# Patient Record
Sex: Female | Born: 1970 | Race: White | Hispanic: No | Marital: Single | Smoking: Never smoker
Health system: Southern US, Community
[De-identification: ages and names within clinical notes are randomized; demographics above are authoritative.]

## PROBLEM LIST (undated history)

## (undated) DIAGNOSIS — F32A Depression, unspecified: Secondary | ICD-10-CM

## (undated) DIAGNOSIS — E278 Other specified disorders of adrenal gland: Secondary | ICD-10-CM

## (undated) DIAGNOSIS — E669 Obesity, unspecified: Secondary | ICD-10-CM

## (undated) DIAGNOSIS — F329 Major depressive disorder, single episode, unspecified: Secondary | ICD-10-CM

## (undated) DIAGNOSIS — D1803 Hemangioma of intra-abdominal structures: Secondary | ICD-10-CM

## (undated) DIAGNOSIS — R87810 Cervical high risk human papillomavirus (HPV) DNA test positive: Secondary | ICD-10-CM

## (undated) DIAGNOSIS — E785 Hyperlipidemia, unspecified: Secondary | ICD-10-CM

## (undated) DIAGNOSIS — K769 Liver disease, unspecified: Secondary | ICD-10-CM

## (undated) DIAGNOSIS — E66811 Obesity, class 1: Secondary | ICD-10-CM

## (undated) HISTORY — DX: Other specified disorders of adrenal gland: E27.8

## (undated) HISTORY — DX: Major depressive disorder, single episode, unspecified: F32.9

## (undated) HISTORY — DX: Liver disease, unspecified: K76.9

## (undated) HISTORY — DX: Hemangioma of intra-abdominal structures: D18.03

## (undated) HISTORY — DX: Depression, unspecified: F32.A

## (undated) HISTORY — DX: Cervical high risk human papillomavirus (HPV) DNA test positive: R87.810

## (undated) HISTORY — PX: COLPOSCOPY: SHX161

## (undated) HISTORY — PX: CHOLECYSTECTOMY: SHX55

## (undated) HISTORY — DX: Obesity, class 1: E66.811

## (undated) HISTORY — DX: Obesity, unspecified: E66.9

## (undated) HISTORY — PX: BREAST ENHANCEMENT SURGERY: SHX7

## (undated) HISTORY — DX: Hyperlipidemia, unspecified: E78.5

---

## 2006-03-12 ENCOUNTER — Encounter: Admission: RE | Admit: 2006-03-12 | Discharge: 2006-03-12 | Payer: Self-pay | Admitting: Internal Medicine

## 2016-10-15 ENCOUNTER — Ambulatory Visit (INDEPENDENT_AMBULATORY_CARE_PROVIDER_SITE_OTHER): Payer: BC Managed Care – PPO | Admitting: Physician Assistant

## 2016-10-15 ENCOUNTER — Encounter: Payer: Self-pay | Admitting: Physician Assistant

## 2016-10-15 VITALS — BP 114/74 | HR 81 | Ht 67.0 in | Wt 211.0 lb

## 2016-10-15 DIAGNOSIS — Z6833 Body mass index (BMI) 33.0-33.9, adult: Secondary | ICD-10-CM | POA: Diagnosis not present

## 2016-10-15 DIAGNOSIS — E6609 Other obesity due to excess calories: Secondary | ICD-10-CM

## 2016-10-15 DIAGNOSIS — Z30011 Encounter for initial prescription of contraceptive pills: Secondary | ICD-10-CM

## 2016-10-15 DIAGNOSIS — Z0000 Encounter for general adult medical examination without abnormal findings: Secondary | ICD-10-CM

## 2016-10-15 LAB — POCT URINE PREGNANCY: Preg Test, Ur: NEGATIVE

## 2016-10-15 MED ORDER — LEVONORGESTREL-ETHINYL ESTRAD 0.1-20 MG-MCG PO TABS: 1.0000 | ORAL_TABLET | Freq: Every day | ORAL | 11 refills | Status: DC

## 2016-10-15 NOTE — Patient Instructions (Addendum)
Use condoms for the next 2 weeks Check your blood pressures at home for the next month  Limit Salt Increase physical activity Follow DASH eating plan Follow-up in 4 weeks   How to Take Your Blood Pressure Blood pressure is a measurement of how strongly your blood is pressing against the walls of your arteries. Arteries are blood vessels that carry blood from your heart throughout your body. Your health care provider takes your blood pressure at each office visit. You can also take your own blood pressure at home with a blood pressure machine. You may need to take your own blood pressure:  To confirm a diagnosis of high blood pressure (hypertension).  To monitor your blood pressure over time.  To make sure your blood pressure medicine is working. Supplies needed: To take your blood pressure, you will need a blood pressure machine. You can buy a blood pressure machine, or blood pressure monitor, at most drugstores or online. There are several types of home blood pressure monitors. When choosing one, consider the following:  Choose a monitor that has an arm cuff.  Choose a monitor that wraps snugly around your upper arm. You should be able to fit only one finger between your arm and the cuff.  Do not choose a monitor that measures your blood pressure from your wrist or finger. Your health care provider can suggest a reliable monitor that will meet your needs. How to prepare To get the most accurate reading, avoid the following for 30 minutes before you check your blood pressure:  Drinking caffeine.  Drinking alcohol.  Eating.  Smoking.  Exercising. Five minutes before you check your blood pressure:  Empty your bladder.  Sit quietly without talking in a dining chair, rather than in a soft couch or armchair. How to take your blood pressure To check your blood pressure, follow the instructions in the manual that came with your blood pressure monitor. If you have a digital blood  pressure monitor, the instructions may be as follows: 1. Sit up straight. 2. Place your feet on the floor. Do not cross your ankles or legs. 3. Rest your left arm at the level of your heart on a table or desk or on the arm of a chair. 4. Pull up your shirt sleeve. 5. Wrap the blood pressure cuff around the upper part of your left arm, 1 inch (2.5 cm) above your elbow. It is best to wrap the cuff around bare skin. 6. Fit the cuff snugly around your arm. You should be able to place only one finger between the cuff and your arm. 7. Position the cord inside the groove of your elbow. 8. Press the power button. 9. Sit quietly while the cuff inflates and deflates. 10. Read the digital reading on the monitor screen and write it down (record it). 11. Wait 2-3 minutes, then repeat the steps, starting at step 1. What does my blood pressure reading mean? A blood pressure reading consists of a higher number over a lower number. Ideally, your blood pressure should be below 120/80. The first ("top") number is called the systolic pressure. It is a measure of the pressure in your arteries as your heart beats. The second ("bottom") number is called the diastolic pressure. It is a measure of the pressure in your arteries as the heart relaxes. Blood pressure is classified into four stages. The following are the stages for adults who do not have a short-term serious illness or a chronic condition. Systolic pressure and  diastolic pressure are measured in a unit called mm Hg. Normal   Systolic pressure: below 432.  Diastolic pressure: below 80. Elevated   Systolic pressure: 761-470.  Diastolic pressure: below 80. Hypertension stage 1   Systolic pressure: 929-574.  Diastolic pressure: 73-40. Hypertension stage 2   Systolic pressure: 370 or above.  Diastolic pressure: 90 or above. You can have prehypertension or hypertension even if only the systolic or only the diastolic number in your reading is higher  than normal. Follow these instructions at home:  Check your blood pressure as often as recommended by your health care provider.  Take your monitor to the next appointment with your health care provider to make sure:  That you are using it correctly.  That it provides accurate readings.  Be sure you understand what your goal blood pressure numbers are.  Tell your health care provider if you are having any side effects from blood pressure medicine. Contact a health care provider if:  Your blood pressure is consistently high. Get help right away if:  Your systolic blood pressure is higher than 180.  Your diastolic blood pressure is higher than 110. This information is not intended to replace advice given to you by your health care provider. Make sure you discuss any questions you have with your health care provider. Document Released: 12/27/2015 Document Revised: 03/10/2016 Document Reviewed: 12/27/2015 Elsevier Interactive Patient Education  2017 [employer].

## 2016-10-15 NOTE — Progress Notes (Signed)
HPI:                                                                Katherine Reeves is a 46 y.o. female who presents to [facility] today to establish care   Current Concerns include contraceptive counseling  Patient [name] presents for annual physical exam and contraceptive counseling. She is sexually active with 1 female partner. She denies abnormal vaginal discharge, vaginal discomfort, pelvic pain, or dyspareunia. She denies breast changes or nipple discharge. She has been on OCP's in the past. She is a nonsmoker. She has no history of HTN, DVT, or clotting disorder.  Health Maintenance Health Maintenance  Topic Date Due  . HIV Screening  03/22/1985  . PAP SMEAR  03/23/1991  . INFLUENZA VACCINE  04/03/2017 (Originally 03/03/2016)  . TETANUS/TDAP  12/01/2024    GYN/Sexual Health [facility]  Menstrual status: having periods  LMP: 09/18/16  Menses: normal  Last pap smear: 3 years ago  History of abnormal pap smears: yes, HPV+   Sexually active: yes, 1 female partner  Current contraception: condoms  Health Habits  Diet: fair, 2 cups coffee/tea/soda per day  Exercise: no  ETOH: yes, 1 drink per month  Tobacco: none  Drugs: none   Past Medical History:  Diagnosis Date  . Cervical high risk HPV (human papillomavirus) test positive   . Depression    Past Surgical History:  Procedure Laterality Date  . BREAST ENHANCEMENT SURGERY    . CHOLECYSTECTOMY    . COLPOSCOPY     Social History  Substance Use Topics  . Smoking status: Never Smoker  . Smokeless tobacco: Never Used  . Alcohol use Yes     Comment: rare   family history includes Breast cancer in her maternal grandfather; Cervical cancer in her maternal aunt.  ROS: negative except as noted in the HPI  Medications: Current Outpatient Prescriptions  Medication Sig Dispense Refill  . levonorgestrel-ethinyl estradiol (AVIANE) 0.1-20 MG-MCG tablet Take 1  tablet by mouth daily. 1 Package 11   No current facility-administered medications for this visit.    No Known Allergies     Objective:  BP 114/74 (BP Location: Left Arm, Cuff Size: Normal)   Pulse 81   Ht 5\' 7"  (1.702 m)   Wt 211 lb (95.7 kg)   BMI 33.05 kg/m   Gen: well-groomed, cooperative, not ill-appearing, no distress HEENT: normal conjunctiva, TM's clear, oropharynx clear, moist mucus membranes, no thyromegaly or tenderness Pulm: Normal work of breathing, clear to auscultation bilaterally CV: Normal rate, regular rhythm, s1 and s2 distinct, no murmurs, clicks or rubs appreciated on this exam, no carotid bruit GI: soft, nondistended, nontender, no masses Neuro: alert and oriented x 3, EOM's intact, PERRLA, DTR's intact MSK: strength 5/5 and symmetric, normal gait, distal pulses intact, no peripheral edema Skin: warm and dry, no rashes or lesions on exposed skin Psych: normal affect, pleasant mood, normal speech and thought content  Depression screen [facility] 2/9 10/15/2016  Decreased Interest 0  Down, Depressed, Hopeless 0  PHQ - 2 Score 0      Assessment and Plan: 46 y.o. female with   1. Encounter for initial prescription of contraceptive pills - POCT urine pregnancy negative - recheck BP  normotensive today - reviewed hormonal and non-hormonal contraceptive options, including LARCs. Patient opted for OCP's. - declined STI testing - levonorgestrel-ethinyl estradiol (AVIANE) 0.1-20 MG-MCG tablet; Take 1 tablet by mouth daily.  Dispense: 1 Package; Refill: 11 - follow-up BP check in 1 month   2. Annual physical exam - CBC - Comprehensive metabolic panel - Hemoglobin A1c - Lipid Panel w/reflex Direct LDL - MM DIGITAL SCREENING BILATERAL; Future - due for Pap smear  3. Obesity (BMI 33) - DASH eating plan - checking lipid panel and A1c   Patient education and anticipatory guidance given Patient agrees with treatment plan Follow-up in 4 weeks for BP check or  sooner as needed  Darlyne Russian PA-C

## 2016-10-18 ENCOUNTER — Encounter: Payer: Self-pay | Admitting: Physician Assistant

## 2016-10-18 DIAGNOSIS — E782 Mixed hyperlipidemia: Secondary | ICD-10-CM | POA: Insufficient documentation

## 2016-10-18 DIAGNOSIS — Z6833 Body mass index (BMI) 33.0-33.9, adult: Secondary | ICD-10-CM

## 2016-10-18 DIAGNOSIS — Z3041 Encounter for surveillance of contraceptive pills: Secondary | ICD-10-CM | POA: Insufficient documentation

## 2016-10-18 DIAGNOSIS — E6609 Other obesity due to excess calories: Secondary | ICD-10-CM | POA: Insufficient documentation

## 2016-11-04 ENCOUNTER — Other Ambulatory Visit (HOSPITAL_COMMUNITY)
Admission: RE | Admit: 2016-11-04 | Discharge: 2016-11-04 | Disposition: A | Discharge status: Home / Self Care | Payer: BC Managed Care – PPO | Source: Ambulatory Visit | Attending: Physician Assistant | Admitting: Physician Assistant

## 2016-11-04 ENCOUNTER — Ambulatory Visit (INDEPENDENT_AMBULATORY_CARE_PROVIDER_SITE_OTHER): Payer: BC Managed Care – PPO | Admitting: Physician Assistant

## 2016-11-04 VITALS — BP 115/76 | HR 75 | Wt 207.0 lb

## 2016-11-04 DIAGNOSIS — Z01419 Encounter for gynecological examination (general) (routine) without abnormal findings: Secondary | ICD-10-CM | POA: Insufficient documentation

## 2016-11-04 DIAGNOSIS — Z1151 Encounter for screening for human papillomavirus (HPV): Secondary | ICD-10-CM | POA: Insufficient documentation

## 2016-11-04 DIAGNOSIS — Z124 Encounter for screening for malignant neoplasm of cervix: Secondary | ICD-10-CM

## 2016-11-04 NOTE — Progress Notes (Signed)
HPI:                                                                Katherine Reeves is a 46 y.o. 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 Denies abnormal vaginal discharge, DUB, abdominal pain or dyspareunia. She recently started COC's and is tolerating them well, but states she has noticed some mood swings.   Past Medical History:  Diagnosis Date  . Cervical high risk HPV (human papillomavirus) test positive   . Depression   . Hyperlipidemia   . Obesity (BMI 30.0-34.9)    Past Surgical History:  Procedure Laterality Date  . BREAST ENHANCEMENT SURGERY    . CHOLECYSTECTOMY    . COLPOSCOPY     Social History  Substance Use Topics  . Smoking status: Never Smoker  . Smokeless tobacco: Never Used  . Alcohol use Yes     Comment: rare   family history includes Breast cancer in her maternal grandfather; Cervical cancer in her maternal aunt.  ROS: negative except as noted in the HPI  Medications: Current Outpatient Prescriptions  Medication Sig Dispense Refill  . levonorgestrel-ethinyl estradiol (AVIANE) 0.1-20 MG-MCG tablet Take 1 tablet by mouth daily. 1 Package 11   No current facility-administered medications for this visit.    No Known Allergies     Objective:  BP 115/76   Pulse 75   Wt 207 lb (93.9 kg)   BMI 32.42 kg/m  Gen: well-groomed, cooperative, not ill-appearing, no distress GU: vulva without rashes or lesions, normal introitus and urethral meatus, vaginal mucosa without erythema, normal discharge, cervix non-friable without lesions Psych: good eye contact, euthymic mood, normal speech and thought content  Depression screen [facility] 2/9 10/15/2016  Decreased Interest 0  Down, Depressed, Hopeless 0  PHQ - 2 Score 0     Assessment and Plan: 45 y.o. female with   1. Pap smear for cervical cancer screening -  Cytology - PAP with HPV co-testing - PHQ2 negative. discussed that if she develops symptoms of depression while on birth control pills, to return   Mammogram ordered at last appointment. Patient states she accidentally deleted voicemail to schedule it. Provided her with phone number for imaging and encouraged her to schedule downstairs in imaging on her way out.  Patient education and anticipatory guidance given Patient agrees with treatment plan Follow-up in 1 year for CPE or sooner as needed  Darlyne Russian PA-C

## 2016-11-04 NOTE — Patient Instructions (Signed)
Health Maintenance, Female Adopting a healthy lifestyle and getting preventive care can go a long way to promote health and wellness. Talk with your health care provider about what schedule of regular examinations is right for you. This is a good chance for you to check in with your provider about disease prevention and staying healthy. In between checkups, there are plenty of things you can do on your own. Experts have done a lot of research about which lifestyle changes and preventive measures are most likely to keep you healthy. Ask your health care provider for more information. Weight and diet Eat a healthy diet  Be sure to include plenty of vegetables, fruits, low-fat dairy products, and lean protein.  Do not eat a lot of foods high in solid fats, added sugars, or salt.  Get regular exercise. This is one of the most important things you can do for your health.  Most adults should exercise for at least 150 minutes each week. The exercise should increase your heart rate and make you sweat (moderate-intensity exercise).  Most adults should also do strengthening exercises at least twice a week. This is in addition to the moderate-intensity exercise. Maintain a healthy weight  Body mass index (BMI) is a measurement that can be used to identify possible weight problems. It estimates body fat based on height and weight. Your health care provider can help determine your BMI and help you achieve or maintain a healthy weight.  For females 47 years of age and older:  A BMI below 18.5 is considered underweight.  A BMI of 18.5 to 24.9 is normal.  A BMI of 25 to 29.9 is considered overweight.  A BMI of 30 and above is considered obese. Watch levels of cholesterol and blood lipids  You should start having your blood tested for lipids and cholesterol at 47 years of age, then have this test every 5 years.  You may need to have your cholesterol levels checked more often if:  Your lipid or  cholesterol levels are high.  You are older than 47 years of age.  You are at high risk for heart disease. Cancer screening Lung Cancer  Lung cancer screening is recommended for adults 47-42 years old who are at high risk for lung cancer because of a history of smoking.  A yearly low-dose CT scan of the lungs is recommended for people who:  Currently smoke.  Have quit within the past 15 years.  Have at least a 30-pack-year history of smoking. A pack year is smoking an average of one pack of cigarettes a day for 1 year.  Yearly screening should continue until it has been 15 years since you quit.  Yearly screening should stop if you develop a health problem that would prevent you from having lung cancer treatment. Breast Cancer  Practice breast self-awareness. This means understanding how your breasts normally appear and feel.  It also means doing regular breast self-exams. Let your health care provider know about any changes, no matter how small.  If you are in your 20s or 30s, you should have a clinical breast exam (CBE) by a health care provider every 1-3 years as part of a regular health exam.  If you are 34 or older, have a CBE every year. Also consider having a breast X-ray (mammogram) every year.  If you have a family history of breast cancer, talk to your health care provider about genetic screening.  If you are at high risk for breast cancer, talk  to your health care provider about having an MRI and a mammogram every year.  Breast cancer gene (BRCA) assessment is recommended for women who have family members with BRCA-related cancers. BRCA-related cancers include:  Breast.  Ovarian.  Tubal.  Peritoneal cancers.  Results of the assessment will determine the need for genetic counseling and BRCA1 and BRCA2 testing. Cervical Cancer  Your health care provider may recommend that you be screened regularly for cancer of the pelvic organs (ovaries, uterus, and vagina).  This screening involves a pelvic examination, including checking for microscopic changes to the surface of your cervix (Pap test). You may be encouraged to have this screening done every 3 years, beginning at age 24.  For women ages 66-65, health care providers may recommend pelvic exams and Pap testing every 3 years, or they may recommend the Pap and pelvic exam, combined with testing for human papilloma virus (HPV), every 5 years. Some types of HPV increase your risk of cervical cancer. Testing for HPV may also be done on women of any age with unclear Pap test results.  Other health care providers may not recommend any screening for nonpregnant women who are considered low risk for pelvic cancer and who do not have symptoms. Ask your health care provider if a screening pelvic exam is right for you.  If you have had past treatment for cervical cancer or a condition that could lead to cancer, you need Pap tests and screening for cancer for at least 20 years after your treatment. If Pap tests have been discontinued, your risk factors (such as having a new sexual partner) need to be reassessed to determine if screening should resume. Some women have medical problems that increase the chance of getting cervical cancer. In these cases, your health care provider may recommend more frequent screening and Pap tests. Colorectal Cancer  This type of cancer can be detected and often prevented.  Routine colorectal cancer screening usually begins at 47 years of age and continues through 47 years of age.  Your health care provider may recommend screening at an earlier age if you have risk factors for colon cancer.  Your health care provider may also recommend using home test kits to check for hidden blood in the stool.  A small camera at the end of a tube can be used to examine your colon directly (sigmoidoscopy or colonoscopy). This is done to check for the earliest forms of colorectal cancer.  Routine  screening usually begins at age 47.  Direct examination of the colon should be repeated every 5-10 years through 47 years of age. However, you may need to be screened more often if early forms of precancerous polyps or small growths are found. Skin Cancer  Check your skin from head to toe regularly.  Tell your health care provider about any new moles or changes in moles, especially if there is a change in a mole's shape or color.  Also tell your health care provider if you have a mole that is larger than the size of a pencil eraser.  Always use sunscreen. Apply sunscreen liberally and repeatedly throughout the day.  Protect yourself by wearing long sleeves, pants, a wide-brimmed hat, and sunglasses whenever you are outside. Heart disease, diabetes, and high blood pressure  High blood pressure causes heart disease and increases the risk of stroke. High blood pressure is more likely to develop in:  People who have blood pressure in the high end of the normal range (130-139/85-89 mm Hg).  People who are overweight or obese.  People who are African American.  If you are 65-6 years of age, have your blood pressure checked every 3-5 years. If you are 33 years of age or older, have your blood pressure checked every year. You should have your blood pressure measured twice-once when you are at a hospital or clinic, and once when you are not at a hospital or clinic. Record the average of the two measurements. To check your blood pressure when you are not at a hospital or clinic, you can use:  An automated blood pressure machine at a pharmacy.  A home blood pressure monitor.  If you are between 61 years and 23 years old, ask your health care provider if you should take aspirin to prevent strokes.  Have regular diabetes screenings. This involves taking a blood sample to check your fasting blood sugar level.  If you are at a normal weight and have a low risk for diabetes, have this test once  every three years after 47 years of age.  If you are overweight and have a high risk for diabetes, consider being tested at a younger age or more often. Preventing infection Hepatitis B  If you have a higher risk for hepatitis B, you should be screened for this virus. You are considered at high risk for hepatitis B if:  You were born in a country where hepatitis B is common. Ask your health care provider which countries are considered high risk.  Your parents were born in a high-risk country, and you have not been immunized against hepatitis B (hepatitis B vaccine).  You have HIV or AIDS.  You use needles to inject street drugs.  You live with someone who has hepatitis B.  You have had sex with someone who has hepatitis B.  You get hemodialysis treatment.  You take certain medicines for conditions, including cancer, organ transplantation, and autoimmune conditions. Hepatitis C  Blood testing is recommended for:  Everyone born from [birth year] through 1966.  Anyone with known risk factors for hepatitis C. Sexually transmitted infections (STIs)  You should be screened for sexually transmitted infections (STIs) including gonorrhea and chlamydia if:  You are sexually active and are younger than 47 years of age.  You are older than 47 years of age and your health care provider tells you that you are at risk for this type of infection.  Your sexual activity has changed since you were last screened and you are at an increased risk for chlamydia or gonorrhea. Ask your health care provider if you are at risk.  If you do not have HIV, but are at risk, it may be recommended that you take a prescription medicine daily to prevent HIV infection. This is called pre-exposure prophylaxis (PrEP). You are considered at risk if:  You are sexually active and do not regularly use condoms or know the HIV status of your partner(s).  You take drugs by injection.  You are sexually active with a partner  who has HIV. Talk with your health care provider about whether you are at high risk of being infected with HIV. If you choose to begin PrEP, you should first be tested for HIV. You should then be tested every 3 months for as long as you are taking PrEP. Pregnancy  If you are premenopausal and you may become pregnant, ask your health care provider about preconception counseling.  If you may become pregnant, take 400 to 800 micrograms (mcg) of folic acid  every day.  If you want to prevent pregnancy, talk to your health care provider about birth control (contraception). Osteoporosis and menopause  Osteoporosis is a disease in which the bones lose minerals and strength with aging. This can result in serious bone fractures. Your risk for osteoporosis can be identified using a bone density scan.  If you are 4 years of age or older, or if you are at risk for osteoporosis and fractures, ask your health care provider if you should be screened.  Ask your health care provider whether you should take a calcium or vitamin D supplement to lower your risk for osteoporosis.  Menopause may have certain physical symptoms and risks.  Hormone replacement therapy may reduce some of these symptoms and risks. Talk to your health care provider about whether hormone replacement therapy is right for you. Follow these instructions at home:  Schedule regular health, dental, and eye exams.  Stay current with your immunizations.  Do not use any tobacco products including cigarettes, chewing tobacco, or electronic cigarettes.  If you are pregnant, do not drink alcohol.  If you are breastfeeding, limit how much and how often you drink alcohol.  Limit alcohol intake to no more than 1 drink per day for nonpregnant women. One drink equals 12 ounces of beer, 5 ounces of wine, or 1 ounces of hard liquor.  Do not use street drugs.  Do not share needles.  Ask your health care provider for help if you need support  or information about quitting drugs.  Tell your health care provider if you often feel depressed.  Tell your health care provider if you have ever been abused or do not feel safe at home. This information is not intended to replace advice given to you by your health care provider. Make sure you discuss any questions you have with your health care provider. Document Released: 02/02/2011 Document Revised: 12/26/2015 Document Reviewed: 04/23/2015 Elsevier Interactive Patient Education  2017 [employer].

## 2016-11-06 LAB — CYTOLOGY - PAP
DIAGNOSIS: NEGATIVE
HPV (WINDOPATH): NOT DETECTED

## 2016-11-11 ENCOUNTER — Ambulatory Visit (INDEPENDENT_AMBULATORY_CARE_PROVIDER_SITE_OTHER): Payer: BC Managed Care – PPO

## 2016-11-11 DIAGNOSIS — Z1231 Encounter for screening mammogram for malignant neoplasm of breast: Secondary | ICD-10-CM

## 2016-11-11 DIAGNOSIS — Z0000 Encounter for general adult medical examination without abnormal findings: Secondary | ICD-10-CM

## 2016-12-08 ENCOUNTER — Telehealth: Payer: Self-pay

## 2016-12-08 DIAGNOSIS — Z3041 Encounter for surveillance of contraceptive pills: Secondary | ICD-10-CM

## 2016-12-08 MED ORDER — NORETHINDRONE ACET-ETHINYL EST 1-20 MG-MCG PO TABS: 1.0000 | ORAL_TABLET | Freq: Every day | ORAL | 11 refills | Status: DC

## 2016-12-08 NOTE — Telephone Encounter (Signed)
Pt is requesting a change be to made with her birht control pills.  She is currently taking Aviane.  She reports that she has always been emotional the week pior and during her period. She noticed since being on Aviane her emotions are worse.  Please advise. -EH/RMA

## 2016-12-08 NOTE — Telephone Encounter (Signed)
Patient contacted practice to report that she was experiencing mood changes on Aviane birth control, specifically feeling "more emotional" the week prior and during her menses. She has been taking this medication for approximately 8 weeks.  Sent new prescription for generic Loestrin, which contains different progesterone hormone. She can start her new pill pack today. Please advise patient to take medication for 3 menstrual cycles to allow body to adjust. If mood persists or gets worse, please schedule an appointment to see me asap.

## 2017-04-26 ENCOUNTER — Emergency Department (HOSPITAL_COMMUNITY): Payer: BC Managed Care – PPO | Admitting: Anesthesiology

## 2017-04-26 ENCOUNTER — Ambulatory Visit (INDEPENDENT_AMBULATORY_CARE_PROVIDER_SITE_OTHER): Payer: BC Managed Care – PPO | Admitting: Physician Assistant

## 2017-04-26 ENCOUNTER — Encounter (HOSPITAL_COMMUNITY): Payer: Self-pay | Admitting: Orthopedic Surgery

## 2017-04-26 ENCOUNTER — Encounter (HOSPITAL_COMMUNITY)
Admission: EM | Disposition: A | Discharge status: Home / Self Care | Payer: Self-pay | Source: Home / Self Care | Attending: Emergency Medicine

## 2017-04-26 ENCOUNTER — Ambulatory Visit (INDEPENDENT_AMBULATORY_CARE_PROVIDER_SITE_OTHER): Payer: BC Managed Care – PPO

## 2017-04-26 ENCOUNTER — Encounter: Payer: Self-pay | Admitting: Physician Assistant

## 2017-04-26 ENCOUNTER — Observation Stay (HOSPITAL_COMMUNITY)
Admission: EM | Admit: 2017-04-26 | Discharge: 2017-04-27 | Disposition: A | Discharge status: Home / Self Care | Payer: BC Managed Care – PPO | Attending: Surgery | Admitting: Surgery

## 2017-04-26 VITALS — BP 117/81 | HR 97 | Temp 98.4°F | Resp 16 | Wt 205.0 lb

## 2017-04-26 DIAGNOSIS — Z793 Long term (current) use of hormonal contraceptives: Secondary | ICD-10-CM | POA: Diagnosis not present

## 2017-04-26 DIAGNOSIS — K353 Acute appendicitis with localized peritonitis, without perforation or gangrene: Secondary | ICD-10-CM

## 2017-04-26 DIAGNOSIS — K37 Unspecified appendicitis: Secondary | ICD-10-CM | POA: Diagnosis present

## 2017-04-26 DIAGNOSIS — K769 Liver disease, unspecified: Secondary | ICD-10-CM | POA: Insufficient documentation

## 2017-04-26 DIAGNOSIS — R1031 Right lower quadrant pain: Secondary | ICD-10-CM

## 2017-04-26 DIAGNOSIS — D1803 Hemangioma of intra-abdominal structures: Secondary | ICD-10-CM | POA: Insufficient documentation

## 2017-04-26 DIAGNOSIS — E278 Other specified disorders of adrenal gland: Secondary | ICD-10-CM | POA: Diagnosis not present

## 2017-04-26 DIAGNOSIS — R109 Unspecified abdominal pain: Secondary | ICD-10-CM | POA: Insufficient documentation

## 2017-04-26 DIAGNOSIS — Z885 Allergy status to narcotic agent status: Secondary | ICD-10-CM | POA: Insufficient documentation

## 2017-04-26 DIAGNOSIS — Z6833 Body mass index (BMI) 33.0-33.9, adult: Secondary | ICD-10-CM | POA: Diagnosis not present

## 2017-04-26 DIAGNOSIS — E6609 Other obesity due to excess calories: Secondary | ICD-10-CM | POA: Insufficient documentation

## 2017-04-26 DIAGNOSIS — K7689 Other specified diseases of liver: Secondary | ICD-10-CM

## 2017-04-26 HISTORY — PX: LAPAROSCOPIC APPENDECTOMY: SHX408

## 2017-04-26 HISTORY — DX: Hemangioma of intra-abdominal structures: D18.03

## 2017-04-26 LAB — COMPREHENSIVE METABOLIC PANEL
ALK PHOS: 61 U/L (ref 38–126)
ALT: 11 U/L — ABNORMAL LOW (ref 14–54)
ANION GAP: 9 (ref 5–15)
AST: 14 U/L — ABNORMAL LOW (ref 15–41)
Albumin: 4 g/dL (ref 3.5–5.0)
BUN: 9 mg/dL (ref 6–20)
CALCIUM: 9.1 mg/dL (ref 8.9–10.3)
CO2: 23 mmol/L (ref 22–32)
Chloride: 102 mmol/L (ref 101–111)
Creatinine, Ser: 0.84 mg/dL (ref 0.44–1.00)
Glucose, Bld: 94 mg/dL (ref 65–99)
Potassium: 3.7 mmol/L (ref 3.5–5.1)
SODIUM: 134 mmol/L — AB (ref 135–145)
Total Bilirubin: 0.6 mg/dL (ref 0.3–1.2)
Total Protein: 7.5 g/dL (ref 6.5–8.1)

## 2017-04-26 LAB — CBC WITH DIFFERENTIAL/PLATELET
Basophils Absolute: 0 K/uL (ref 0.0–0.1)
Basophils Relative: 0 %
Eosinophils Absolute: 0.2 K/uL (ref 0.0–0.7)
Eosinophils Relative: 1 %
HCT: 43.1 % (ref 36.0–46.0)
Hemoglobin: 14.1 g/dL (ref 12.0–15.0)
Lymphocytes Relative: 20 %
Lymphs Abs: 2.2 K/uL (ref 0.7–4.0)
MCH: 29.9 pg (ref 26.0–34.0)
MCHC: 32.7 g/dL (ref 30.0–36.0)
MCV: 91.5 fL (ref 78.0–100.0)
Monocytes Absolute: 0.7 K/uL (ref 0.1–1.0)
Monocytes Relative: 7 %
Neutro Abs: 7.7 K/uL (ref 1.7–7.7)
Neutrophils Relative %: 72 %
Platelets: 341 K/uL (ref 150–400)
RBC: 4.71 MIL/uL (ref 3.87–5.11)
RDW: 13.2 % (ref 11.5–15.5)
WBC: 10.8 K/uL — ABNORMAL HIGH (ref 4.0–10.5)

## 2017-04-26 LAB — POC URINE PREG, ED: Preg Test, Ur: NEGATIVE

## 2017-04-26 SURGERY — APPENDECTOMY, LAPAROSCOPIC
Anesthesia: General | Site: Abdomen

## 2017-04-26 MED ORDER — ONDANSETRON HCL 4 MG/2ML IJ SOLN
INTRAMUSCULAR | Status: AC
  Filled 2017-04-26: qty 2

## 2017-04-26 MED ORDER — OXYCODONE HCL 5 MG PO TABS: 5.0000 mg | ORAL_TABLET | ORAL | Status: DC | PRN

## 2017-04-26 MED ORDER — POTASSIUM CHLORIDE IN NACL 20-0.9 MEQ/L-% IV SOLN
INTRAVENOUS | Status: DC
  Administered 2017-04-26 – 2017-04-27 (×2): via INTRAVENOUS
  Filled 2017-04-26 (×2): qty 1000

## 2017-04-26 MED ORDER — FENTANYL CITRATE (PF) 250 MCG/5ML IJ SOLN
INTRAMUSCULAR | Status: AC
  Filled 2017-04-26: qty 5

## 2017-04-26 MED ORDER — LIDOCAINE 2% (20 MG/ML) 5 ML SYRINGE
INTRAMUSCULAR | Status: AC
  Filled 2017-04-26: qty 5

## 2017-04-26 MED ORDER — KETOROLAC TROMETHAMINE 30 MG/ML IJ SOLN
INTRAMUSCULAR | Status: DC | PRN
  Administered 2017-04-26: 30 mg via INTRAVENOUS

## 2017-04-26 MED ORDER — HYDROMORPHONE HCL 1 MG/ML IJ SOLN: 0.2500 mg | INTRAMUSCULAR | Status: DC | PRN

## 2017-04-26 MED ORDER — SUGAMMADEX SODIUM 200 MG/2ML IV SOLN
INTRAVENOUS | Status: AC
  Filled 2017-04-26: qty 2

## 2017-04-26 MED ORDER — ONDANSETRON HCL 4 MG/2ML IJ SOLN: 4.0000 mg | Freq: Four times a day (QID) | INTRAMUSCULAR | Status: DC | PRN

## 2017-04-26 MED ORDER — MIDAZOLAM HCL 5 MG/5ML IJ SOLN
INTRAMUSCULAR | Status: DC | PRN
  Administered 2017-04-26: 2 mg via INTRAVENOUS

## 2017-04-26 MED ORDER — SODIUM CHLORIDE 0.9 % IR SOLN
Status: DC | PRN
  Administered 2017-04-26: 1000 mL

## 2017-04-26 MED ORDER — MIDAZOLAM HCL 2 MG/2ML IJ SOLN
INTRAMUSCULAR | Status: AC
  Filled 2017-04-26: qty 2

## 2017-04-26 MED ORDER — LACTATED RINGERS IV SOLN
INTRAVENOUS | Status: DC | PRN
  Administered 2017-04-26: 20:00:00 via INTRAVENOUS

## 2017-04-26 MED ORDER — 0.9 % SODIUM CHLORIDE (POUR BTL) OPTIME
TOPICAL | Status: DC | PRN
  Administered 2017-04-26: 1000 mL

## 2017-04-26 MED ORDER — ONDANSETRON HCL 4 MG/2ML IJ SOLN
INTRAMUSCULAR | Status: DC | PRN
  Administered 2017-04-26: 4 mg via INTRAVENOUS

## 2017-04-26 MED ORDER — DEXAMETHASONE SODIUM PHOSPHATE 10 MG/ML IJ SOLN
INTRAMUSCULAR | Status: AC
  Filled 2017-04-26: qty 1

## 2017-04-26 MED ORDER — MEPERIDINE HCL 25 MG/ML IJ SOLN: 6.2500 mg | INTRAMUSCULAR | Status: DC | PRN

## 2017-04-26 MED ORDER — PROPOFOL 10 MG/ML IV BOLUS
INTRAVENOUS | Status: AC
  Filled 2017-04-26: qty 20

## 2017-04-26 MED ORDER — HYDROMORPHONE HCL 1 MG/ML IJ SOLN: 1.0000 mg | INTRAMUSCULAR | Status: DC | PRN

## 2017-04-26 MED ORDER — BUPIVACAINE-EPINEPHRINE (PF) 0.5% -1:200000 IJ SOLN
INTRAMUSCULAR | Status: AC
  Filled 2017-04-26: qty 30

## 2017-04-26 MED ORDER — KETOROLAC TROMETHAMINE 30 MG/ML IJ SOLN
INTRAMUSCULAR | Status: AC
  Filled 2017-04-26: qty 1

## 2017-04-26 MED ORDER — ONDANSETRON 4 MG PO TBDP
4.0000 mg | ORAL_TABLET | Freq: Four times a day (QID) | ORAL | Status: DC | PRN
  Filled 2017-04-26: qty 1

## 2017-04-26 MED ORDER — TRAMADOL HCL 50 MG PO TABS: 50.0000 mg | ORAL_TABLET | Freq: Four times a day (QID) | ORAL | Status: DC | PRN

## 2017-04-26 MED ORDER — DEXTROSE 5 % IV SOLN
2.0000 g | Freq: Once | INTRAVENOUS | Status: AC
  Administered 2017-04-26: 2 g via INTRAVENOUS
  Filled 2017-04-26: qty 2

## 2017-04-26 MED ORDER — DEXAMETHASONE SODIUM PHOSPHATE 10 MG/ML IJ SOLN
INTRAMUSCULAR | Status: DC | PRN
  Administered 2017-04-26: 10 mg via INTRAVENOUS

## 2017-04-26 MED ORDER — METRONIDAZOLE IN NACL 5-0.79 MG/ML-% IV SOLN
500.0000 mg | Freq: Once | INTRAVENOUS | Status: AC
  Administered 2017-04-26: 500 mg via INTRAVENOUS
  Filled 2017-04-26: qty 100

## 2017-04-26 MED ORDER — ROCURONIUM BROMIDE 100 MG/10ML IV SOLN
INTRAVENOUS | Status: DC | PRN
  Administered 2017-04-26: 50 mg via INTRAVENOUS

## 2017-04-26 MED ORDER — LIDOCAINE HCL (CARDIAC) 20 MG/ML IV SOLN
INTRAVENOUS | Status: DC | PRN
  Administered 2017-04-26: 100 mg via INTRAVENOUS

## 2017-04-26 MED ORDER — SUGAMMADEX SODIUM 500 MG/5ML IV SOLN
INTRAVENOUS | Status: DC | PRN
  Administered 2017-04-26: 500 mg via INTRAVENOUS

## 2017-04-26 MED ORDER — IOPAMIDOL (ISOVUE-300) INJECTION 61%
100.0000 mL | Freq: Once | INTRAVENOUS | Status: AC | PRN
  Administered 2017-04-26: 100 mL via INTRAVENOUS

## 2017-04-26 MED ORDER — DIPHENHYDRAMINE HCL 50 MG/ML IJ SOLN: 25.0000 mg | Freq: Four times a day (QID) | INTRAMUSCULAR | Status: DC | PRN

## 2017-04-26 MED ORDER — SCOPOLAMINE 1 MG/3DAYS TD PT72
MEDICATED_PATCH | TRANSDERMAL | Status: DC | PRN
  Administered 2017-04-26: 1 via TRANSDERMAL

## 2017-04-26 MED ORDER — ROCURONIUM BROMIDE 10 MG/ML (PF) SYRINGE
PREFILLED_SYRINGE | INTRAVENOUS | Status: AC
  Filled 2017-04-26: qty 5

## 2017-04-26 MED ORDER — SUGAMMADEX SODIUM 500 MG/5ML IV SOLN
INTRAVENOUS | Status: AC
  Filled 2017-04-26: qty 5

## 2017-04-26 MED ORDER — DIPHENHYDRAMINE HCL 25 MG PO CAPS: 25.0000 mg | ORAL_CAPSULE | Freq: Four times a day (QID) | ORAL | Status: DC | PRN

## 2017-04-26 MED ORDER — PROPOFOL 10 MG/ML IV BOLUS
INTRAVENOUS | Status: DC | PRN
  Administered 2017-04-26: 200 mg via INTRAVENOUS

## 2017-04-26 MED ORDER — BUPIVACAINE-EPINEPHRINE 0.5% -1:200000 IJ SOLN
INTRAMUSCULAR | Status: DC | PRN
  Administered 2017-04-26: 20 mL

## 2017-04-26 MED ORDER — PROMETHAZINE HCL 25 MG/ML IJ SOLN
INTRAMUSCULAR | Status: AC
  Filled 2017-04-26: qty 1

## 2017-04-26 MED ORDER — PROMETHAZINE HCL 25 MG/ML IJ SOLN
6.2500 mg | INTRAMUSCULAR | Status: DC | PRN
  Administered 2017-04-26: 6.25 mg via INTRAVENOUS

## 2017-04-26 MED ORDER — ENOXAPARIN SODIUM 40 MG/0.4ML ~~LOC~~ SOLN: 40.0000 mg | SUBCUTANEOUS | Status: DC

## 2017-04-26 MED ORDER — FENTANYL CITRATE (PF) 100 MCG/2ML IJ SOLN
INTRAMUSCULAR | Status: DC | PRN
  Administered 2017-04-26: 50 ug via INTRAVENOUS
  Administered 2017-04-26: 100 ug via INTRAVENOUS

## 2017-04-26 SURGICAL SUPPLY — 35 items
APPLIER CLIP 5 13 M/L LIGAMAX5 (MISCELLANEOUS)
APPLIER CLIP ROT 10 11.4 M/L (STAPLE)
CANISTER SUCT 3000ML PPV (MISCELLANEOUS) ×2 IMPLANT
CHLORAPREP W/TINT 26ML (MISCELLANEOUS) ×2 IMPLANT
CLIP APPLIE 5 13 M/L LIGAMAX5 (MISCELLANEOUS) IMPLANT
CLIP APPLIE ROT 10 11.4 M/L (STAPLE) IMPLANT
COVER SURGICAL LIGHT HANDLE (MISCELLANEOUS) ×2 IMPLANT
CUTTER FLEX LINEAR 45M (STAPLE) IMPLANT
DERMABOND ADVANCED (GAUZE/BANDAGES/DRESSINGS) ×1
DERMABOND ADVANCED .7 DNX12 (GAUZE/BANDAGES/DRESSINGS) ×1 IMPLANT
ELECT REM PT RETURN 9FT ADLT (ELECTROSURGICAL) ×2
ELECTRODE REM PT RTRN 9FT ADLT (ELECTROSURGICAL) ×1 IMPLANT
GLOVE SURG SIGNA 7.5 PF LTX (GLOVE) ×2 IMPLANT
GOWN STRL REUS W/ TWL LRG LVL3 (GOWN DISPOSABLE) ×2 IMPLANT
GOWN STRL REUS W/ TWL XL LVL3 (GOWN DISPOSABLE) ×1 IMPLANT
GOWN STRL REUS W/TWL LRG LVL3 (GOWN DISPOSABLE) ×2
GOWN STRL REUS W/TWL XL LVL3 (GOWN DISPOSABLE) ×1
KIT BASIN OR (CUSTOM PROCEDURE TRAY) ×2 IMPLANT
KIT ROOM TURNOVER OR (KITS) ×2 IMPLANT
NS IRRIG 1000ML POUR BTL (IV SOLUTION) ×2 IMPLANT
PAD ARMBOARD 7.5X6 YLW CONV (MISCELLANEOUS) ×4 IMPLANT
POUCH SPECIMEN RETRIEVAL 10MM (ENDOMECHANICALS) ×2 IMPLANT
RELOAD 45 VASCULAR/THIN (ENDOMECHANICALS) IMPLANT
RELOAD STAPLE TA45 3.5 REG BLU (ENDOMECHANICALS) IMPLANT
SET IRRIG TUBING LAPAROSCOPIC (IRRIGATION / IRRIGATOR) ×2 IMPLANT
SHEARS HARMONIC ACE PLUS 36CM (ENDOMECHANICALS) ×2 IMPLANT
SLEEVE ENDOPATH XCEL 5M (ENDOMECHANICALS) ×2 IMPLANT
SPECIMEN JAR SMALL (MISCELLANEOUS) ×2 IMPLANT
SUT MON AB 4-0 PC3 18 (SUTURE) ×2 IMPLANT
TOWEL OR 17X24 6PK STRL BLUE (TOWEL DISPOSABLE) ×2 IMPLANT
TOWEL OR 17X26 10 PK STRL BLUE (TOWEL DISPOSABLE) ×2 IMPLANT
TRAY LAPAROSCOPIC MC (CUSTOM PROCEDURE TRAY) ×2 IMPLANT
TROCAR XCEL BLUNT TIP 100MML (ENDOMECHANICALS) ×2 IMPLANT
TROCAR XCEL NON-BLD 5MMX100MML (ENDOMECHANICALS) ×2 IMPLANT
TUBING INSUFFLATION (TUBING) ×2 IMPLANT

## 2017-04-26 NOTE — Op Note (Signed)
Appendectomy, Lap, Procedure Note  Indications: The patient presented with a history of right-sided abdominal pain. A CT revealed findings consistent with acute appendicitis.  Pre-operative Diagnosis: acute appendictis  Post-operative Diagnosis: Same  Surgeon: Coralie Keens A   Assistants: 0  Anesthesia: General endotracheal anesthesia  ASA Class: 2  Procedure Details  The patient was seen again in the Holding Room. The risks, benefits, complications, treatment options, and expected outcomes were discussed with the patient and/or family. The possibilities of reaction to medication, perforation of viscus, bleeding, recurrent infection, finding a normal appendix, the need for additional procedures, failure to diagnose a condition, and creating a complication requiring transfusion or operation were discussed. There was concurrence with the proposed plan and informed consent was obtained. The site of surgery was properly noted. The patient was taken to Operating Room, identified as Tyishia Aune and the procedure verified as Appendectomy. A Time Out was held and the above information confirmed.  The patient was placed in the supine position and general anesthesia was induced, along with placement of orogastric tube, Venodyne boots, and a Foley catheter. The abdomen was prepped and draped in a sterile fashion. A one centimeter infraumbilical incision was made.  The  midline fascia was incised with a #15 blade.  A Kelly clamp was used to confirm entrance into the peritoneal cavity.  A pursestring suture was passed around the incision with a 0 Vicryl.  The Hasson was introduced into the abdomen and the tails of the suture were used to hold the Hasson in place.   The pneumoperitoneum was then established to steady pressure of 15 mmHg.  Additional 5 mm cannulas then placed in the left lower quadrant of the abdomen and the right upper quadrant region under direct visualization. A careful  evaluation of the entire abdomen was carried out. The patient was placed in Trendelenburg and left lateral decubitus position. The small intestines were retracted in the cephalad and left lateral direction away from the pelvis and right lower quadrant. The patient was found to have an enlarged and inflamed appendix that was extending into the pelvis. There was no evidence of perforation.  The appendix was carefully dissected. The appendix was was skeletonized with the harmonic scalpel.   The appendix was divided at its base using an endo-GIA stapler. Minimal appendiceal stump was left in place. There was no evidence of bleeding, leakage, or complication after division of the appendix. Irrigation was also performed and irrigate suctioned from the abdomen as well.  The umbilical port site was closed with the purse string suture. There was no residual palpable fascial defect.  The trocar site skin wounds were closed with 4-0 Monocryl.  Instrument, sponge, and needle counts were correct at the conclusion of the case.   Findings: The appendix was found to be inflamed. There were not signs of necrosis.  There was not perforation. There was not abscess formation.  Estimated Blood Loss:  Minimal         Drains:none         Complications:  None; patient tolerated the procedure well.         Disposition: PACU - hemodynamically stable.         Condition: stable

## 2017-04-26 NOTE — Anesthesia Preprocedure Evaluation (Addendum)
Anesthesia Evaluation  Patient identified by MRN, date of birth, ID band Patient awake    Reviewed: Allergy & Precautions, NPO status , Patient's Chart, lab work & pertinent test results  Airway Mallampati: II  TM Distance: >3 FB Neck ROM: Full    Dental no notable dental hx.    Pulmonary neg pulmonary ROS,    Pulmonary exam normal breath sounds clear to auscultation       Cardiovascular negative cardio ROS Normal cardiovascular exam Rhythm:Regular Rate:Normal     Neuro/Psych negative neurological ROS  negative psych ROS   GI/Hepatic negative GI ROS, Neg liver ROS,   Endo/Other  negative endocrine ROS  Renal/GU negative Renal ROS     Musculoskeletal negative musculoskeletal ROS (+)   Abdominal   Peds  Hematology negative hematology ROS (+)   Anesthesia Other Findings   Reproductive/Obstetrics negative OB ROS                                                             Anesthesia Evaluation  Patient identified by MRN, date of birth, ID band Patient awake    Reviewed: Allergy & Precautions, NPO status , Patient's Chart, lab work & pertinent test results  Airway        Dental   Pulmonary neg pulmonary ROS,           Cardiovascular negative cardio ROS       Neuro/Psych negative neurological ROS  negative psych ROS   GI/Hepatic negative GI ROS, Neg liver ROS,   Endo/Other  negative endocrine ROS  Renal/GU negative Renal ROS     Musculoskeletal negative musculoskeletal ROS (+)   Abdominal   Peds  Hematology negative hematology ROS (+)   Anesthesia Other Findings   Reproductive/Obstetrics negative OB ROS                             Anesthesia Physical Anesthesia Plan  ASA: II  Anesthesia Plan: General   Post-op Pain Management:    Induction: Intravenous, Rapid sequence and Cricoid pressure planned  PONV Risk Score  and Plan: 3 and Ondansetron, Dexamethasone, Midazolam and Metaclopromide  Airway Management Planned: Oral ETT  Additional Equipment:   Intra-op Plan:   Post-operative Plan: Extubation in OR  Informed Consent: I have reviewed the patients History and Physical, chart, labs and discussed the procedure including the risks, benefits and alternatives for the proposed anesthesia with the patient or authorized representative who has indicated his/her understanding and acceptance.   Dental advisory given  Plan Discussed with: CRNA  Anesthesia Plan Comments:         Anesthesia Quick Evaluation  Anesthesia Physical Anesthesia Plan  ASA: II and emergent  Anesthesia Plan: General   Post-op Pain Management:    Induction: Intravenous, Rapid sequence and Cricoid pressure planned  PONV Risk Score and Plan: 3 and Ondansetron, Dexamethasone, Midazolam and Metaclopromide  Airway Management Planned: Oral ETT  Additional Equipment:   Intra-op Plan:   Post-operative Plan: Extubation in OR  Informed Consent: I have reviewed the patients History and Physical, chart, labs and discussed the procedure including the risks, benefits and alternatives for the proposed anesthesia with the patient or authorized representative who has indicated his/her understanding and acceptance.   Dental advisory  given  Plan Discussed with: CRNA  Anesthesia Plan Comments:        Anesthesia Quick Evaluation

## 2017-04-26 NOTE — ED Triage Notes (Signed)
Patient sent by MD office with confirmed appendicitis. No schedule for OR at this time.

## 2017-04-26 NOTE — Anesthesia Procedure Notes (Signed)
Procedure Name: Intubation Date/Time: 04/26/2017 8:06 PM Performed by: Manuela Schwartz B Pre-anesthesia Checklist: Patient identified, Emergency Drugs available, Suction available and Patient being monitored Patient Re-evaluated:Patient Re-evaluated prior to induction Oxygen Delivery Method: Circle System Utilized Preoxygenation: Pre-oxygenation with 100% oxygen Induction Type: IV induction Ventilation: Mask ventilation without difficulty Laryngoscope Size: Mac and 3 Grade View: Grade I Tube type: Oral Tube size: 7.0 mm Number of attempts: 1 Airway Equipment and Method: Stylet and Oral airway Placement Confirmation: ETT inserted through vocal cords under direct vision,  positive ETCO2 and breath sounds checked- equal and bilateral Secured at: 21 cm Tube secured with: Tape Dental Injury: Teeth and Oropharynx as per pre-operative assessment

## 2017-04-26 NOTE — ED Notes (Signed)
Report given to OR.

## 2017-04-26 NOTE — ED Provider Notes (Signed)
[facility] DEPT Provider Note   CSN: [id number] Arrival date & time: 04/26/17  1434     History   Chief Complaint No chief complaint on file.   HPI Katherine Reeves is a 46 y.o. female.  HPI  46 year old female presents with acute appendicitis. She has been having abdominal pain since last night around 5 PM. She states it started all of a sudden. Mostly was right upper quadrant/flank but now his right lower quadrant. Movement worsens the pain if she lays still it is mild to moderate. No nausea or vomiting or fevers. No diarrhea or urinary symptoms. Went to her PCP today and a CT scan was ordered which confirmed acute appendicitis. She was sent to the ER for evaluation. LMP 3 weeks ago.  Past Medical History:  Diagnosis Date  . Cervical high risk HPV (human papillomavirus) test positive   . Depression   . Hyperlipidemia   . Obesity (BMI 30.0-34.9)     Patient Active Problem List   Diagnosis Date Noted  . Right flank pain 04/26/2017  . 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 Acute appendicitis with localized peritonitis 04/26/2017  . Liver lesion, right lobe 04/26/2017  . Oral contraceptive use 10/18/2016  . Mixed hyperlipidemia 10/18/2016  . Class 1 obesity due to excess calories without serious comorbidity with body mass index (BMI) of 33.0 to 33.9 in adult 10/18/2016    Past Surgical History:  Procedure Laterality Date  . BREAST ENHANCEMENT SURGERY    . CHOLECYSTECTOMY    . COLPOSCOPY      OB History    No data available       Home Medications    Prior to Admission medications   Not on File    Family History Family History  Problem Relation Age of Onset  . Cervical cancer Maternal Aunt   . Breast cancer Maternal Grandfather     Social History Social History  Substance Use Topics  . Smoking status: Never Smoker  . Smokeless tobacco: Never Used  . Alcohol use Yes     Comment: rare     Allergies   Hydrocodone   Review of  Systems Review of Systems  Constitutional: Negative for fever.  Gastrointestinal: Positive for abdominal pain. Negative for diarrhea, nausea and vomiting.  Genitourinary: Negative for dysuria, hematuria and menstrual problem.  Musculoskeletal: Negative for back pain.  All other systems reviewed and are negative.    Physical Exam Updated Vital Signs BP 108/71   Pulse 96   Temp 98.4 F (36.9 C) (Oral)   Resp 14   Ht 5\' 5"  (1.651 m)   Wt 93 kg (205 lb)   LMP 04/05/2017 (Approximate) Comment: signed preg test waiver  SpO2 98%   BMI 34.11 kg/m   Physical Exam  Constitutional: She is oriented to person, place, and time. She appears well-developed and well-nourished.  HENT:  Head: Normocephalic and atraumatic.  Right Ear: External ear normal.  Left Ear: External ear normal.  Nose: Nose normal.  Eyes: Right eye exhibits no discharge. Left eye exhibits no discharge.  Cardiovascular: Normal rate, regular rhythm and normal heart sounds.   Pulmonary/Chest: Effort normal and breath sounds normal.  Abdominal: Soft. There is tenderness in the right lower quadrant. There is no rigidity and no rebound.  Palpation in LLQ causes pain in RLQ  Neurological: She is alert and oriented to person, place, and time.  Skin: Skin is warm and dry.  Nursing note and vitals reviewed.    ED Treatments /  Results  Labs (all labs ordered are listed, but only abnormal results are displayed) Labs Reviewed  CBC WITH DIFFERENTIAL/PLATELET - Abnormal; Notable for the following:       Result Value   WBC 10.8 (*)    All other components within normal limits  COMPREHENSIVE METABOLIC PANEL - Abnormal; Notable for the following:    Sodium 134 (*)    AST 14 (*)    ALT 11 (*)    All other components within normal limits  POC URINE PREG, ED    EKG  EKG Interpretation None       Radiology Ct Abdomen Pelvis W Contrast  Result Date: 04/26/2017 CLINICAL DATA:  Acute right lower quadrant abdominal  pain. EXAM: CT ABDOMEN AND PELVIS WITH CONTRAST TECHNIQUE: Multidetector CT imaging of the abdomen and pelvis was performed using the standard protocol following bolus administration of intravenous contrast. CONTRAST:  199mL ISOVUE-300 IOPAMIDOL (ISOVUE-300) INJECTION 61% COMPARISON:  None. FINDINGS: Lower chest: No acute abnormality. Hepatobiliary: Status post cholecystectomy. 1.9 cm low density is noted superiorly in right hepatic lobe which most likely represents hemangioma, but is not diagnostic for this based on CT criteria. No other abnormality is noted in the liver. Pancreas: Unremarkable. No pancreatic ductal dilatation or surrounding inflammatory changes. Spleen: Normal in size without focal abnormality. Adrenals/Urinary Tract: 1.4 cm left adrenal nodule is noted. Right adrenal gland appears normal. No hydronephrosis or renal obstruction is noted. No renal or ureteral calculi are noted. Urinary bladder is unremarkable. Stomach/Bowel: The stomach appears normal. There is no evidence of bowel obstruction. The appendix is enlarged and inflamed consistent with acute appendicitis. It is retrocecal in position. Vascular/Lymphatic: No significant vascular findings are present. No enlarged abdominal or pelvic lymph nodes. Reproductive: Uterus and bilateral adnexa are unremarkable. Other: No abdominal wall hernia or abnormality. No abdominopelvic ascites. Musculoskeletal: No acute or significant osseous findings. IMPRESSION: Findings consistent with acute appendicitis. No definite abscess formation is noted. The appendix is retrocecal in position. These results will be called to the ordering clinician or representative by the Radiologist Assistant, and communication documented in the PACS or zVision Dashboard. 1.4 cm left adrenal nodule is noted. Follow-up CT scan or MRI in 12 months is recommended. 1.9 cm low density is noted in dome of right hepatic lobe which most likely represents hemangioma, but is not  diagnostic for this based on CT criteria. Further evaluation with MRI of the liver with and without gadolinium is recommended on nonemergent basis. Electronically Signed   By: Marijo Conception, M.D.   On: 04/26/2017 12:37    Procedures Procedures (including critical care time)  Medications Ordered in ED Medications  cefTRIAXone (ROCEPHIN) 2 g in dextrose 5 % 50 mL IVPB (0 g Intravenous Stopped 04/26/17 1830)    And  metroNIDAZOLE (FLAGYL) IVPB 500 mg (500 mg Intravenous New Bag/Given 04/26/17 1831)     Initial Impression / Assessment and Plan / ED Course  I have reviewed the triage vital signs and the nursing notes.  Pertinent labs & imaging results that were available during my care of the patient were reviewed by me and considered in my medical decision making (see chart for details).  Clinical Course as of Apr 27 1931  Mon Apr 26, 2017  1720 Discussed case/results with Dr. Ninfa Linden. He will come eval in ED. Will give IV antibiotics. Declines pain meds at this time.  [SG]    Clinical Course User Index [SG] Sherwood Gambler, MD    CT from PCP shows  appendicitis without complications. NPO, admit to surgery  Final Clinical Impressions(s) / ED Diagnoses   Final diagnoses:  Acute appendicitis with localized peritonitis    New Prescriptions There are no discharge medications for this patient.    Sherwood Gambler, MD 04/26/17 [phone]

## 2017-04-26 NOTE — Anesthesia Postprocedure Evaluation (Signed)
Anesthesia Post Note  Patient: Katherine Reeves  Procedure(s) Performed: Procedure(s) (LRB): APPENDECTOMY LAPAROSCOPIC (N/A)     Patient location during evaluation: PACU Anesthesia Type: General Level of consciousness: sedated and patient cooperative Pain management: pain level controlled Vital Signs Assessment: post-procedure vital signs reviewed and stable Respiratory status: spontaneous breathing Cardiovascular status: stable Anesthetic complications: no    Last Vitals:  Vitals:   04/26/17 2200 04/26/17 2218  BP: 104/71 104/69  Pulse: 83 83  Resp: 15 16  Temp:  36.7 C  SpO2: 95% 95%    Last Pain:  Vitals:   04/26/17 2218  TempSrc:   PainSc: [place]

## 2017-04-26 NOTE — Transfer of Care (Signed)
Immediate Anesthesia Transfer of Care Note  Patient: Katherine Reeves  Procedure(s) Performed: Procedure(s): APPENDECTOMY LAPAROSCOPIC (N/A)  Patient Location: PACU  Anesthesia Type:General  Level of Consciousness: awake, alert  and oriented  Airway & Oxygen Therapy: Patient Spontanous Breathing  Post-op Assessment: Report given to RN and Post -op Vital signs reviewed and stable  Post vital signs: Reviewed and stable  Last Vitals:  Vitals:   04/26/17 1900 04/26/17 2050  BP: 108/71 (!) 101/55  Pulse: 96 100  Resp:  16  Temp:  (!) 36.3 C  SpO2: 98% 98%    Last Pain:  Vitals:   04/26/17 2050  TempSrc:   PainSc: 0-No pain         Complications: No apparent anesthesia complications

## 2017-04-26 NOTE — Progress Notes (Signed)
CT abd/pel showing acute appendicitis Reviewed results face-to-face with Katherine Reeves and her mother in the radiology department Instructed to present to [facility] ED asap  Incidental adrenal and liver findings were also noted. Explained what an incidentaloma and hemangioma are. Recommended follow-up appointment when patient has been discharged from the hospital to discuss in more detail.

## 2017-04-26 NOTE — Progress Notes (Signed)
HPI:                                                                Katherine Reeves is a 46 y.o. female who presents to [facility] [place] today for right flank pain  Patient reports sudden onset right flank pain starting around 5pm yesterday. Pain is now radiating to her right lower abdomen. Pain began after eating at a restaurant in [place]. She also endorsed some nausea without vomiting. Reports pain is moderate, persistent. Worsened by laying flat, position change, difficulty rising from laying, coughing/sneezing. Relieved by sitting upright. Denies change in bowel or bladder function.   Past Medical History:  Diagnosis Date  . Cervical high risk HPV (human papillomavirus) test positive   . Depression   . Hyperlipidemia   . Obesity (BMI 30.0-34.9)    Past Surgical History:  Procedure Laterality Date  . BREAST ENHANCEMENT SURGERY    . CHOLECYSTECTOMY    . COLPOSCOPY     Social History  Substance Use Topics  . Smoking status: Never Smoker  . Smokeless tobacco: Never Used  . Alcohol use Yes     Comment: rare   family history includes Breast cancer in her maternal grandfather; Cervical cancer in her maternal aunt.  ROS: Review of Systems  Constitutional: Negative.   Respiratory: Negative.   Cardiovascular: Negative.   Gastrointestinal: Positive for abdominal pain and nausea. Negative for constipation, diarrhea and vomiting.  Genitourinary: Negative.      Medications: No current outpatient prescriptions on file.   No current facility-administered medications for this visit.    No Known Allergies     Objective:  BP 117/81   Pulse 97   Temp 98.4 F (36.9 C) (Oral)   Resp 16   Wt 205 lb (93 kg)   LMP 04/05/2017 (Approximate)   BMI 32.11 kg/m  Gen:  alert, not ill-appearing, appears uncomfortable, no acute distress HEENT: head normocephalic without obvious abnormality, conjunctiva and cornea clear, trachea  midline Pulm: Normal work of breathing, normal phonation, clear to auscultation bilaterally, no wheezes, rales or rhonchi CV: Normal rate, regular rhythm, s1 and s2 distinct, no murmurs, clicks or rubs  GI: abdomen obese, soft, there is RLQ tenderness/McBurney's point tenderness with guarding Neuro: alert and oriented x 3, no tremor MSK: extremities atraumatic, walking with a stooped gait Skin: intact, no rashes on exposed skin, no jaundice, no cyanosis   No results found for this or any previous visit (from the past 72 hour(s)). No results found.    Assessment and Plan: 46 y.o. female with   1. Right lower quadrant abdominal pain - symptoms concerning for acute appendicitis. Differential also includes diverticulitis and colitis. - CT Abdomen Pelvis W Contrast stat   Patient education and anticipatory guidance given Patient agrees with treatment plan Follow-up as needed if symptoms worsen or fail to improve  Darlyne Russian PA-C

## 2017-04-26 NOTE — ED Notes (Signed)
PA from MD office called, asking why patient was in [place]. Explained that she would be triaged shortly.

## 2017-04-26 NOTE — H&P (Signed)
Katherine Reeves is an 46 y.o. female.   Chief Complaint: Right lower quadrant abdominal pain HPI: This is a 46 year old female who presented with a one-day history of abdominal pain. She reports that the pain started suddenly. It then moved to the right lower quadrant and side. It is worse with motion. She describes the pain as sharp and moderate. She has had no nausea or vomiting. She saw her primary care physician who ordered a CT scan showing appendicitis should she was then sent to the emergency department. She is otherwise without complaints.  Past Medical History:  Diagnosis Date  . Cervical high risk HPV (human papillomavirus) test positive   . Depression   . Hyperlipidemia   . Obesity (BMI 30.0-34.9)     Past Surgical History:  Procedure Laterality Date  . BREAST ENHANCEMENT SURGERY    . CHOLECYSTECTOMY    . COLPOSCOPY      Family History  Problem Relation Age of Onset  . Cervical cancer Maternal Aunt   . Breast cancer Maternal Grandfather    Social History:  reports that she has never smoked. She has never used smokeless tobacco. She reports that she drinks alcohol. She reports that she does not use drugs.  Allergies:  Allergies  Allergen Reactions  . Hydrocodone Itching and Other (See Comments)    Odd thoughts and "weird dreams," also     (Not in a hospital admission)  Results for orders placed or performed during the hospital encounter of 04/26/17 (from the past 48 hour(s))  CBC with Differential     Status: Abnormal   Collection Time: 04/26/17  4:10 PM  Result Value Ref Range   WBC 10.8 (H) 4.0 - 10.5 K/uL   RBC 4.71 3.87 - 5.11 MIL/uL   Hemoglobin 14.1 12.0 - 15.0 g/dL   HCT 43.1 36.0 - 46.0 %   MCV 91.5 78.0 - 100.0 fL   MCH 29.9 26.0 - 34.0 pg   MCHC 32.7 30.0 - 36.0 g/dL   RDW 13.2 11.5 - 15.5 %   Platelets 341 150 - 400 K/uL   Neutrophils Relative % 72 %   Neutro Abs 7.7 1.7 - 7.7 K/uL   Lymphocytes Relative 20 %   Lymphs Abs 2.2 0.7 - 4.0 K/uL    Monocytes Relative 7 %   Monocytes Absolute 0.7 0.1 - 1.0 K/uL   Eosinophils Relative 1 %   Eosinophils Absolute 0.2 0.0 - 0.7 K/uL   Basophils Relative 0 %   Basophils Absolute 0.0 0.0 - 0.1 K/uL  Comprehensive metabolic panel     Status: Abnormal   Collection Time: 04/26/17  4:10 PM  Result Value Ref Range   Sodium 134 (L) 135 - 145 mmol/L   Potassium 3.7 3.5 - 5.1 mmol/L   Chloride 102 101 - 111 mmol/L   CO2 23 22 - 32 mmol/L   Glucose, Bld 94 65 - 99 mg/dL   BUN 9 6 - 20 mg/dL   Creatinine, Ser 0.84 0.44 - 1.00 mg/dL   Calcium 9.1 8.9 - 10.3 mg/dL   Total Protein 7.5 6.5 - 8.1 g/dL   Albumin 4.0 3.5 - 5.0 g/dL   AST 14 (L) 15 - 41 U/L   ALT 11 (L) 14 - 54 U/L   Alkaline Phosphatase 61 38 - 126 U/L   Total Bilirubin 0.6 0.3 - 1.2 mg/dL   GFR calc non Af Amer >60 >60 mL/min   GFR calc Af Amer >60 >60 mL/min    Comment: (  NOTE) The eGFR has been calculated using the CKD EPI equation. This calculation has not been validated in all clinical situations. eGFR's persistently <60 mL/min signify possible Chronic Kidney Disease.    Anion gap 9 5 - 15  POC urine preg, ED     Status: None   Collection Time: 04/26/17  5:21 PM  Result Value Ref Range   Preg Test, Ur NEGATIVE NEGATIVE    Comment:        THE SENSITIVITY OF THIS METHODOLOGY IS >24 mIU/mL    Ct Abdomen Pelvis W Contrast  Result Date: 04/26/2017 CLINICAL DATA:  Acute right lower quadrant abdominal pain. EXAM: CT ABDOMEN AND PELVIS WITH CONTRAST TECHNIQUE: Multidetector CT imaging of the abdomen and pelvis was performed using the standard protocol following bolus administration of intravenous contrast. CONTRAST:  174m ISOVUE-300 IOPAMIDOL (ISOVUE-300) INJECTION 61% COMPARISON:  None. FINDINGS: Lower chest: No acute abnormality. Hepatobiliary: Status post cholecystectomy. 1.9 cm low density is noted superiorly in right hepatic lobe which most likely represents hemangioma, but is not diagnostic for this based on CT  criteria. No other abnormality is noted in the liver. Pancreas: Unremarkable. No pancreatic ductal dilatation or surrounding inflammatory changes. Spleen: Normal in size without focal abnormality. Adrenals/Urinary Tract: 1.4 cm left adrenal nodule is noted. Right adrenal gland appears normal. No hydronephrosis or renal obstruction is noted. No renal or ureteral calculi are noted. Urinary bladder is unremarkable. Stomach/Bowel: The stomach appears normal. There is no evidence of bowel obstruction. The appendix is enlarged and inflamed consistent with acute appendicitis. It is retrocecal in position. Vascular/Lymphatic: No significant vascular findings are present. No enlarged abdominal or pelvic lymph nodes. Reproductive: Uterus and bilateral adnexa are unremarkable. Other: No abdominal wall hernia or abnormality. No abdominopelvic ascites. Musculoskeletal: No acute or significant osseous findings. IMPRESSION: Findings consistent with acute appendicitis. No definite abscess formation is noted. The appendix is retrocecal in position. These results will be called to the ordering clinician or representative by the Radiologist Assistant, and communication documented in the PACS or zVision Dashboard. 1.4 cm left adrenal nodule is noted. Follow-up CT scan or MRI in 12 months is recommended. 1.9 cm low density is noted in dome of right hepatic lobe which most likely represents hemangioma, but is not diagnostic for this based on CT criteria. Further evaluation with MRI of the liver with and without gadolinium is recommended on nonemergent basis. Electronically Signed   By: JMarijo Conception M.D.   On: 04/26/2017 12:37    Review of Systems  Constitutional: Negative for chills and fever.  Respiratory: Negative for shortness of breath.   Cardiovascular: Negative for chest pain.  Gastrointestinal: Positive for abdominal pain. Negative for nausea and vomiting.  Genitourinary: Negative for dysuria.  All other systems  reviewed and are negative.   Blood pressure 122/79, pulse 96, temperature 98.4 F (36.9 C), temperature source Oral, resp. rate 14, height '5\' 5"'$  (1.651 m), weight 93 kg (205 lb), last menstrual period 04/05/2017, SpO2 100 %. Physical Exam  Constitutional: She is oriented to person, place, and time. She appears well-developed and well-nourished. No distress.  HENT:  Head: Normocephalic and atraumatic.  Right Ear: External ear normal.  Left Ear: External ear normal.  Nose: Nose normal.  Mouth/Throat: Oropharynx is clear and moist. No oropharyngeal exudate.  Eyes: Pupils are equal, round, and reactive to light. Right eye exhibits no discharge. Left eye exhibits no discharge. No scleral icterus.  Neck: Normal range of motion. Neck supple. No tracheal deviation present.  Cardiovascular: Normal rate, regular rhythm, normal heart sounds and intact distal pulses.   No murmur heard. Respiratory: Effort normal and breath sounds normal. No respiratory distress. She has no wheezes.  GI: Soft. There is tenderness. There is guarding.  There is mild to moderate tenderness with guarding in the right lower abdomen. The rest of the abdomen is soft and nontender  Musculoskeletal: Normal range of motion. She exhibits no edema or tenderness.  Neurological: She is alert and oriented to person, place, and time.  Skin: Skin is warm and dry. No rash noted. She is not diaphoretic. No erythema.  Psychiatric: Her behavior is normal. Judgment normal.     Assessment/Plan Acute appendicitis  I discussed the diagnosis with the patient and her family. Removal of the appendix is recommended. I discussed lap her scalp gap and ectomy in detail. I discussed the risk of surgery which includes but is not limited to bleeding, infection, injury to surrounding structures, appendiceal stump leak, the need for further procedures, cardiopulmonary issues, DVT, postoperative recovery, etc. She understands and wishes to proceed with  surgery which will be scheduled. There are findings on her CT scan in the adrenal gland and liver which will need to be followed up per radiology's recommendations as an outpatient  [facility] A, MD 04/26/2017, 6:05 PM

## 2017-04-26 NOTE — ED Notes (Signed)
Needs surgery consult for confirmed appendicitis. CT completed this morning.

## 2017-04-27 ENCOUNTER — Encounter (HOSPITAL_COMMUNITY): Payer: Self-pay | Admitting: Surgery

## 2017-04-27 LAB — BASIC METABOLIC PANEL
ANION GAP: 7 (ref 5–15)
BUN: 11 mg/dL (ref 6–20)
CALCIUM: 8.5 mg/dL — AB (ref 8.9–10.3)
CO2: 23 mmol/L (ref 22–32)
Chloride: 106 mmol/L (ref 101–111)
Creatinine, Ser: 0.79 mg/dL (ref 0.44–1.00)
Glucose, Bld: 144 mg/dL — ABNORMAL HIGH (ref 65–99)
POTASSIUM: 4.4 mmol/L (ref 3.5–5.1)
SODIUM: 136 mmol/L (ref 135–145)

## 2017-04-27 LAB — CBC
HCT: 37.7 % (ref 36.0–46.0)
Hemoglobin: 12.2 g/dL (ref 12.0–15.0)
MCH: 29.6 pg (ref 26.0–34.0)
MCHC: 32.4 g/dL (ref 30.0–36.0)
MCV: 91.5 fL (ref 78.0–100.0)
PLATELETS: 302 10*3/uL (ref 150–400)
RBC: 4.12 MIL/uL (ref 3.87–5.11)
RDW: 13.2 % (ref 11.5–15.5)
WBC: 7.3 10*3/uL (ref 4.0–10.5)

## 2017-04-27 MED ORDER — IBUPROFEN 800 MG PO TABS
400.0000 mg | ORAL_TABLET | Freq: Three times a day (TID) | ORAL | 0 refills | Status: DC | PRN
Start: 2017-04-27 — End: 2017-05-13

## 2017-04-27 MED ORDER — ACETAMINOPHEN 325 MG PO TABS: 650.0000 mg | ORAL_TABLET | Freq: Four times a day (QID) | ORAL | Status: DC | PRN

## 2017-04-27 NOTE — Progress Notes (Signed)
Patient arrived to unit from PACU via stretcher. Patient oriented to room. Personal items and call bell within reach of patient. Patient oriented to room and orders reviewed with patient. Patient verbalizes understanding,

## 2017-04-27 NOTE — Progress Notes (Signed)
Pt discharged home with mom. AVS reviewed and given to patient. Work note given to patient. All belongings sent with patient. VSS. BP 112/65 (BP Location: Right Arm)   Pulse 84   Temp 98.5 F (36.9 C) (Oral)   Resp 16   Ht 5\' 5"  (1.651 m)   Wt 93 kg (205 lb)   LMP 04/05/2017 (Approximate) Comment: signed preg test waiver  SpO2 97%   BMI 34.11 kg/m

## 2017-04-27 NOTE — Discharge Instructions (Signed)
please arrive at least 30 min before your appointment to complete your check in paperwork.  If you are unable to arrive 30 min prior to your appointment time we may have to cancel or reschedule you. ° °LAPAROSCOPIC SURGERY: POST OP INSTRUCTIONS  °1. DIET: Follow a light bland diet the first 24 hours after arrival home, such as soup, liquids, crackers, etc. Be sure to include lots of fluids daily. Avoid fast food or heavy meals as your are more likely to get nauseated. Eat a low fat the next few days after surgery.  °2. Take your usually prescribed home medications unless otherwise directed. °3. PAIN CONTROL:  °1. Pain is best controlled by a usual combination of three different methods TOGETHER:  °1. Ice/Heat °2. Over the counter pain medication °3. Prescription pain medication °2. Most patients will experience some swelling and bruising around the incisions. Ice packs or heating pads (30-60 minutes up to 6 times a day) will help. Use ice for the first few days to help decrease swelling and bruising, then switch to heat to help relax tight/sore spots and speed recovery. Some people prefer to use ice alone, heat alone, alternating between ice & heat. Experiment to what works for you. Swelling and bruising can take several weeks to resolve.  °3. It is helpful to take an over-the-counter pain medication regularly for the first few weeks. Choose one of the following that works best for you:  °1. Naproxen (Aleve, etc) Two 220mg tabs twice a day °2. Ibuprofen (Advil, etc) Three 200mg tabs four times a day (every meal & bedtime) °3. Acetaminophen (Tylenol, etc) 500-650mg four times a day (every meal & bedtime) °4. A prescription for pain medication (such as oxycodone, hydrocodone, etc) should be given to you upon discharge. Take your pain medication as prescribed.  °1. If you are having problems/concerns with the prescription medicine (does not control pain, nausea, vomiting, rash, itching, etc), please call us [phone] to see if we need to switch you to a different pain medicine that will work better for you and/or control your side effect better. °2. If you need a refill on your pain medication, please contact your pharmacy. They will contact our office to request authorization. Prescriptions will not be filled after 5 pm or on week-ends. °4. Avoid getting constipated. Between the surgery and the pain medications, it is common to experience some constipation. Increasing fluid intake and taking a fiber supplement (such as Metamucil, Citrucel, FiberCon, MiraLax, etc) 1-2 times a day regularly will usually help prevent this problem from occurring. A mild laxative (prune juice, Milk of Magnesia, MiraLax, etc) should be taken according to package directions if there are no bowel movements after 48 hours.  °5. Watch out for diarrhea. If you have many loose bowel movements, simplify your diet to bland foods & liquids for a few days. Stop any stool softeners and decrease your fiber supplement. Switching to mild anti-diarrheal medications (Kayopectate, Pepto Bismol) can help. If this worsens or does not improve, please call us. °6. Wash / shower every day. You may shower over the dressings as they are waterproof. Continue to shower over incision(s) after the dressing is off. °7. Remove your waterproof bandages 5 days after surgery. You may leave the incision open to air. You may replace a dressing/Band-Aid to cover the incision for comfort if you wish.  °8. ACTIVITIES as tolerated:  °1. You may resume regular (light) daily activities beginning the next day--such as daily self-care, walking, climbing stairs--gradually   increasing activities as tolerated. If you can walk 30 minutes without difficulty, it is safe to try more intense activity such as jogging, treadmill, bicycling, low-impact aerobics, swimming, etc. °2. Save the most intensive and strenuous activity for last such as sit-ups, heavy lifting, contact sports, etc Refrain  from any heavy lifting or straining until you are off narcotics for pain control.  °3. DO NOT PUSH THROUGH PAIN. Let pain be your guide: If it hurts to do something, don't do it. Pain is your body warning you to avoid that activity for another week until the pain goes down. °4. You may drive when you are no longer taking prescription pain medication, you can comfortably wear a seatbelt, and you can safely maneuver your car and apply brakes. °5. You may have sexual intercourse when it is comfortable.  °9. FOLLOW UP in our office  °1. Please call [facility] at [phone] to set up an appointment to see your surgeon in the office for a follow-up appointment approximately 2-3 weeks after your surgery. °2. Make sure that you call for this appointment the day you arrive home to insure a convenient appointment time. °     10. IF YOU HAVE DISABILITY OR FAMILY LEAVE FORMS, BRING THEM TO THE               OFFICE FOR PROCESSING.  ° °WHEN TO CALL US [phone]:  °1. Poor pain control °2. Reactions / problems with new medications (rash/itching, nausea, etc)  °3. Fever over 101.5 F (38.5 C) °4. Inability to urinate °5. Nausea and/or vomiting °6. Worsening swelling or bruising °7. Continued bleeding from incision. °8. Increased pain, redness, or drainage from the incision ° °The clinic staff is available to answer your questions during regular business hours (8:30am-5pm). Please don’t hesitate to call and ask to speak to one of our nurses for clinical concerns.  °If you have a medical emergency, go to the nearest emergency room or call 911.  °A surgeon from [facility] is always on call at the hospitals  ° °[facility]  °[address], [address], [place], [place] [address] ?  °MAIN: [phone] ? TOLL FREE: [phone] ?  °FAX [phone]  °[email or web address] ° °

## 2017-04-27 NOTE — Discharge Summary (Signed)
[facility] Discharge Summary   Patient ID: Katherine Reeves MRN: [id number] DOB/AGE: November 25, 1970 46 y.o.  Admit date: 04/26/2017 Discharge date: 04/27/2017  Discharge Diagnosis Patient Active Problem List   Diagnosis Date Noted  . Right flank pain 04/26/2017  . 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 Acute appendicitis with localized peritonitis 04/26/2017  . Liver lesion, right lobe 04/26/2017  . Appendicitis 04/26/2017  . Oral contraceptive use 10/18/2016  . Mixed hyperlipidemia 10/18/2016  . Class 1 obesity due to excess calories without serious comorbidity with body mass index (BMI) of 33.0 to 33.9 in adult 10/18/2016   Imaging: Ct Abdomen Pelvis W Contrast  Result Date: 04/26/2017 CLINICAL DATA:  Acute right lower quadrant abdominal pain. EXAM: CT ABDOMEN AND PELVIS WITH CONTRAST TECHNIQUE: Multidetector CT imaging of the abdomen and pelvis was performed using the standard protocol following bolus administration of intravenous contrast. CONTRAST:  137mL ISOVUE-300 IOPAMIDOL (ISOVUE-300) INJECTION 61% COMPARISON:  None. FINDINGS: Lower chest: No acute abnormality. Hepatobiliary: Status post cholecystectomy. 1.9 cm low density is noted superiorly in right hepatic lobe which most likely represents hemangioma, but is not diagnostic for this based on CT criteria. No other abnormality is noted in the liver. Pancreas: Unremarkable. No pancreatic ductal dilatation or surrounding inflammatory changes. Spleen: Normal in size without focal abnormality. Adrenals/Urinary Tract: 1.4 cm left adrenal nodule is noted. Right adrenal gland appears normal. No hydronephrosis or renal obstruction is noted. No renal or ureteral calculi are noted. Urinary bladder is unremarkable. Stomach/Bowel: The stomach appears normal. There is no evidence of bowel obstruction. The appendix is enlarged and inflamed consistent with acute appendicitis. It is retrocecal in position. Vascular/Lymphatic: No  significant vascular findings are present. No enlarged abdominal or pelvic lymph nodes. Reproductive: Uterus and bilateral adnexa are unremarkable. Other: No abdominal wall hernia or abnormality. No abdominopelvic ascites. Musculoskeletal: No acute or significant osseous findings. IMPRESSION: Findings consistent with acute appendicitis. No definite abscess formation is noted. The appendix is retrocecal in position. These results will be called to the ordering clinician or representative by the Radiologist Assistant, and communication documented in the PACS or zVision Dashboard. 1.4 cm left adrenal nodule is noted. Follow-up CT scan or MRI in 12 months is recommended. 1.9 cm low density is noted in dome of right hepatic lobe which most likely represents hemangioma, but is not diagnostic for this based on CT criteria. Further evaluation with MRI of the liver with and without gadolinium is recommended on nonemergent basis. Electronically Signed   By: Marijo Conception, M.D.   On: 04/26/2017 12:37    Procedures Dr. Coralie Keens (04/26/17) - Laparoscopic Appendectomy  Hospital Course:  46 y/o female who presented to [facility] with RLQ pain  Workup showed acute, uncomplicated appendicitis.  Patient was admitted and underwent procedure listed above.  Tolerated procedure well and was transferred to the floor.  Diet was advanced as tolerated.  On POD#1, the patient was voiding well, tolerating diet, ambulating well, pain well controlled, vital signs stable, incisions c/d/i and felt stable for discharge home.  Patient will follow up in our office in 2 weeks and knows to call with questions or concerns.  She denies narcotic medications.   Physical Exam: General:  Alert, NAD, pleasant, comfortable Abd:  Soft, ND, mild tenderness, incisions C/D/I  Allergies as of 04/27/2017      Reactions   Hydrocodone Itching, Other (See Comments)   Odd thoughts and "weird dreams," also      Medication List    TAKE these  medications   acetaminophen 325 MG tablet Commonly known as:  TYLENOL Take 2 tablets (650 mg total) by mouth every 6 (six) hours as needed.   ibuprofen 800 MG tablet Commonly known as:  ADVIL,MOTRIN Take 0.5-1 tablets (400-800 mg total) by mouth every 8 (eight) hours as needed.            Discharge Care Instructions        Start     Ordered   04/27/17 0000  ibuprofen (ADVIL,MOTRIN) 800 MG tablet  Every 8 hours PRN     04/27/17 1016   04/27/17 0000  acetaminophen (TYLENOL) 325 MG tablet  Every 6 hours PRN     04/27/17 1016       Follow-up [facility] Follow up.   Specialty:  General Surgery Why:  call to confirm post-operative appointment date and time. Contact information: [address] [place] [phone]          Signed: Obie Dredge, [facility] 04/27/2017, 10:16 AM Pager: [phone] Consults: [phone] Mon-Fri 7:00 am-4:30 pm Sat-Sun 7:00 am-11:30 am

## 2017-05-13 ENCOUNTER — Ambulatory Visit (INDEPENDENT_AMBULATORY_CARE_PROVIDER_SITE_OTHER): Payer: BC Managed Care – PPO | Admitting: Physician Assistant

## 2017-05-13 ENCOUNTER — Encounter: Payer: Self-pay | Admitting: Physician Assistant

## 2017-05-13 VITALS — BP 133/83 | HR 84 | Temp 98.5°F | Wt 206.0 lb

## 2017-05-13 DIAGNOSIS — Z6833 Body mass index (BMI) 33.0-33.9, adult: Secondary | ICD-10-CM | POA: Diagnosis not present

## 2017-05-13 DIAGNOSIS — K769 Liver disease, unspecified: Secondary | ICD-10-CM | POA: Diagnosis not present

## 2017-05-13 DIAGNOSIS — Z23 Encounter for immunization: Secondary | ICD-10-CM

## 2017-05-13 DIAGNOSIS — E6609 Other obesity due to excess calories: Secondary | ICD-10-CM

## 2017-05-13 DIAGNOSIS — R635 Abnormal weight gain: Secondary | ICD-10-CM | POA: Diagnosis not present

## 2017-05-13 MED ORDER — PHENTERMINE HCL 37.5 MG PO TABS: ORAL_TABLET | ORAL | 0 refills | Status: DC

## 2017-05-13 NOTE — Patient Instructions (Signed)
-   Calories 1100-1400 per day to lose approximately 1 pound per week - Use MyFitnessPal or some app to measure intake - Follow Heart Healthy diet such as DASH or Mediterranean - Try to walk at least 4 days per week for 40 minutes a session - Follow-up in 4 weeks  Physical Activity Recommendations for modifying lipids and lowering blood pressure Engage in aerobic physical activity to reduce LDL-cholesterol, non-HDL-cholesterol, and blood pressure  Frequency: 3-4 sessions per week  Intensity: moderate to vigorous  Duration: 40 minutes on average  Physical Activity Recommendations for secondary prevention 1. Aerobic exercise  Frequency: 3-5 sessions per week  Intensity: 50-80% capacity  Duration: 20 - 60 minutes  Examples: walking, treadmill, cycling, rowing, stair climbing, and arm/leg ergometry  2. Resistance exercise  Frequency: 2-3 sessions per week  Intensity: 10-15 repetitions/set to moderate fatigue  Duration: 1-3 sets of 8-10 upper and lower body exercises  Examples: calisthenics, elastic bands, cuff/hand weights, dumbbels, free weights, wall pulleys, and weight machines  Heart-Healthy Lifestyle  Eating a diet rich in vegetables, fruits and whole grains: also includes low-fat dairy products, poultry, fish, legumes, and nuts; limit intake of sweets, sugar-sweetened beverages and red meats  Getting regular exercise  Maintaining a healthy weight  Not smoking or getting help quitting  Staying on top of your health; for some people, lifestyle changes alone may not be enough to prevent a heart attack or stroke. In these cases, taking a statin at the right dose will most likely be necessary

## 2017-05-13 NOTE — Progress Notes (Signed)
HPI:                                                                Katherine Reeves is a 46 y.o. female who presents to [facility] today for post-op follow-up and weight gain  This pleasant 46 yo F with PMH depression, HLD, obesity presents today for follow-up after laparoscopic appendectomy on 04/26/17. She has had no surgical complications. Reports she is doing well. 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 Follow-up imaging was recommended.  Patient also is concerned about weight gain. She reports she has had progressive weight gain over the last 3 years that she attributes to stress and emotional eating. She has been on Phentermine in the past and reports 40 pound weight loss. She is interested in trialing Phentermine again.   Past Medical History:  Diagnosis Date  . 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 Cervical high risk HPV (human papillomavirus) test positive   . Depression   . Hyperlipidemia   . Liver lesion, right lobe   . Obesity (BMI 30.0-34.9)    Past Surgical History:  Procedure Laterality Date  . BREAST ENHANCEMENT SURGERY    . CHOLECYSTECTOMY    . COLPOSCOPY    . LAPAROSCOPIC APPENDECTOMY N/A 04/26/2017   Procedure: APPENDECTOMY LAPAROSCOPIC;  Surgeon: Coralie Keens, MD;  Location: [facility];  Service: General;  Laterality: N/A;   Social History  Substance Use Topics  . Smoking status: Never Smoker  . Smokeless tobacco: Never Used  . Alcohol use Yes     Comment: rare   family history includes Breast cancer in her maternal grandfather; Cervical cancer in her maternal aunt.  ROS: negative except as noted in the HPI  Medications: Current Outpatient Prescriptions  Medication Sig Dispense Refill  . phentermine (ADIPEX-P) 37.5 MG tablet One tab by mouth qAM 30 tablet 0   No current facility-administered medications for this visit.     Allergies  Allergen Reactions  . Hydrocodone Itching and Other (See Comments)    Odd thoughts and "weird dreams," also       Objective:  BP 133/83   Pulse 84   Temp 98.5 F (36.9 C) (Oral)   Wt 206 lb (93.4 kg)   BMI 34.28 kg/m  Gen:  alert, not ill-appearing, no distress, appropriate for age, obese female HEENT: head normocephalic without obvious abnormality, conjunctiva and cornea clear, trachea midline Pulm: Normal work of breathing, normal phonation, clear to auscultation bilaterally, no wheezes, rales or rhonchi CV: Normal rate, regular rhythm, s1 and s2 distinct, no murmurs, clicks or rubs  Neuro: alert and oriented x 3, no tremor MSK: extremities atraumatic, normal gait and station Skin: intact, no rashes on exposed skin, no jaundice, no cyanosis Psych: well-groomed, cooperative, good eye contact, euthymic mood, affect mood-congruent, speech is articulate, and thought processes clear and goal-directed    No results found for this or any previous visit (from the past 72 hour(s)). No results found.    Assessment and Plan: 46 y.o. female with   1. Liver lesion, right lobe - MR LIVER W WO CONTRAST; Future  2. Liver disease - MR LIVER W WO CONTRAST; Future  3.  Abnormal weight gain Weight loss goal: 1 pound per week (12 pounds in 3 months) Dietary goal: declines nutritionist referral, will measure calories and limit to 1100-1400 per day, DASH or Mediterranean diet Exercise goal: walk 4 days per week x 40 minutes - no contraindications to Phentermine. Has tolerated well in the past. OCP's for birth control - phentermine (ADIPEX-P) 37.5 MG tablet; One tab by mouth qAM  Dispense: 30 tablet; Refill: 0  4. Class 1 obesity due to excess calories without serious comorbidity with body mass index (BMI) of 33.0 to 33.9 in adult - phentermine (ADIPEX-P) 37.5 MG tablet; One tab by mouth qAM  Dispense: 30 tablet; Refill: 0  5. Need for immunization against influenza - Flu  Vaccine QUAD 36+ mos IM   Patient education and anticipatory guidance given Patient agrees with treatment plan Follow-up in 4 weeks for weight check or sooner as needed if symptoms worsen or fail to improve  I spent 25 minutes with this patient, greater than 50% was face-to-face time counseling regarding the above diagnoses   Darlyne Russian PA-C

## 2017-05-24 ENCOUNTER — Encounter: Payer: Self-pay | Admitting: Physician Assistant

## 2017-05-24 ENCOUNTER — Ambulatory Visit (INDEPENDENT_AMBULATORY_CARE_PROVIDER_SITE_OTHER): Payer: BC Managed Care – PPO

## 2017-05-24 DIAGNOSIS — D3502 Benign neoplasm of left adrenal gland: Secondary | ICD-10-CM

## 2017-05-24 DIAGNOSIS — K769 Liver disease, unspecified: Secondary | ICD-10-CM | POA: Diagnosis not present

## 2017-05-24 MED ORDER — GADOBENATE DIMEGLUMINE 529 MG/ML IV SOLN
19.0000 mL | Freq: Once | INTRAVENOUS | Status: AC | PRN
  Administered 2017-05-24: 19 mL via INTRAVENOUS

## 2017-05-24 NOTE — Progress Notes (Signed)
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 No further testing is needed We will repeat imaging of the adrenal gland in 11 months

## 2017-06-10 ENCOUNTER — Ambulatory Visit (INDEPENDENT_AMBULATORY_CARE_PROVIDER_SITE_OTHER): Payer: BC Managed Care – PPO | Admitting: Physician Assistant

## 2017-06-10 DIAGNOSIS — E6609 Other obesity due to excess calories: Secondary | ICD-10-CM | POA: Diagnosis not present

## 2017-06-10 DIAGNOSIS — Z6833 Body mass index (BMI) 33.0-33.9, adult: Secondary | ICD-10-CM

## 2017-06-10 DIAGNOSIS — R635 Abnormal weight gain: Secondary | ICD-10-CM

## 2017-06-10 MED ORDER — PHENTERMINE HCL 37.5 MG PO TABS: ORAL_TABLET | ORAL | 0 refills | Status: AC

## 2017-06-10 NOTE — Progress Notes (Signed)
Pt is here for a weight check.  Denies SOB, insomnia, and heart palpitations. Pt lost 7 lbs.  A new Rx was printed to be faxed to [facility]. Pt advised to make next appointment in 4 weeks. -EH/RMA

## 2017-06-11 ENCOUNTER — Encounter: Payer: Self-pay | Admitting: Physician Assistant

## 2017-06-11 DIAGNOSIS — R635 Abnormal weight gain: Secondary | ICD-10-CM | POA: Insufficient documentation

## 2017-07-09 ENCOUNTER — Ambulatory Visit: Payer: BC Managed Care – PPO

## 2018-07-31 IMAGING — CT CT ABD-PELV W/ CM
2 of 5 series · 16 of 46 positions shown, 18 images · IV contrast (APPLIED)
Comparison: None.

CLINICAL DATA: Acute right lower quadrant abdominal pain.

EXAM:
CT ABDOMEN AND PELVIS WITH CONTRAST
TECHNIQUE: Multidetector CT imaging of the abdomen and pelvis was performed
using the standard protocol following bolus administration of
intravenous contrast.
CONTRAST:  100mL ZEPSRH-H88 IOPAMIDOL (ZEPSRH-H88) INJECTION 61%

[Series 2: axial st · axial · 0.91mm/px · z∈[-462,-12]mm · 13 of 102 slices shown, 15 images]
[im 6/102  soft-tissue]
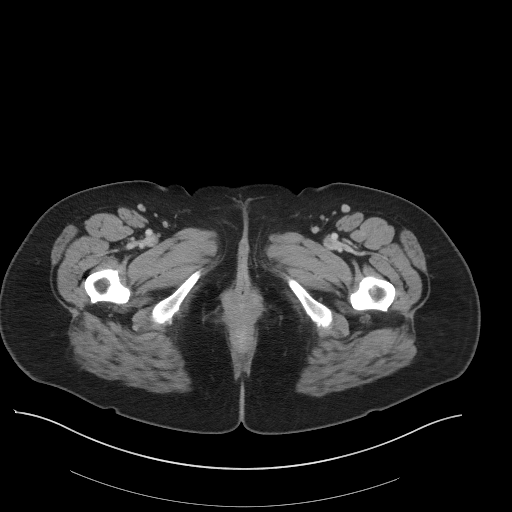
[im 6/102  bone]
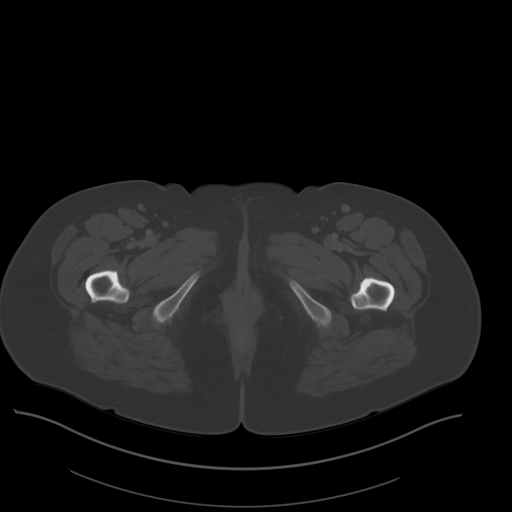
[im 12/102  soft-tissue]
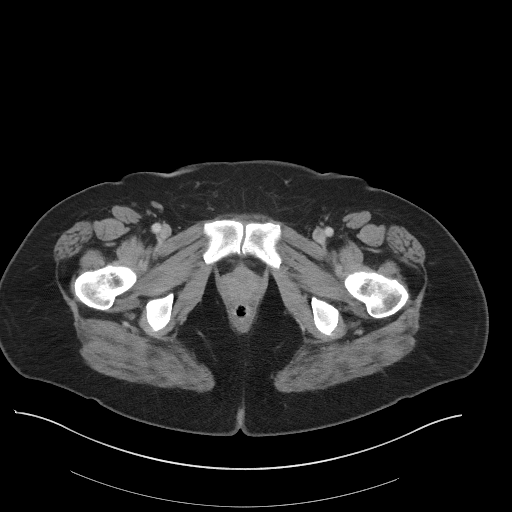
[im 23/102  soft-tissue]
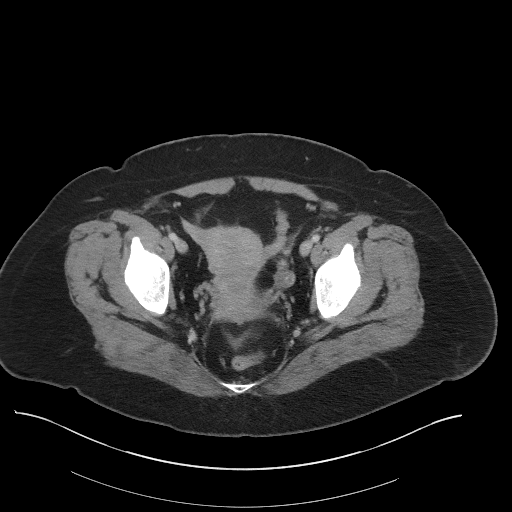
[im 29/102  soft-tissue]
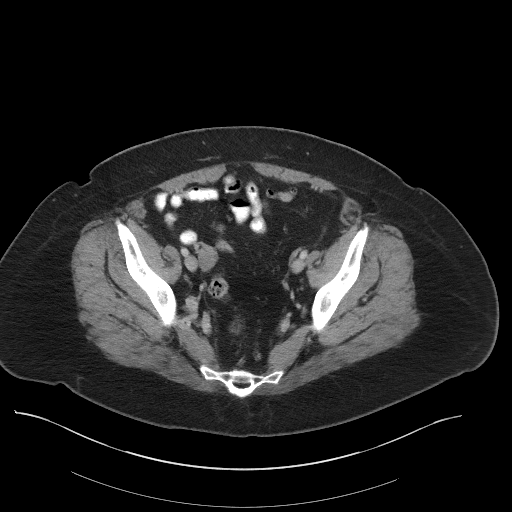
[im 34/102  soft-tissue]
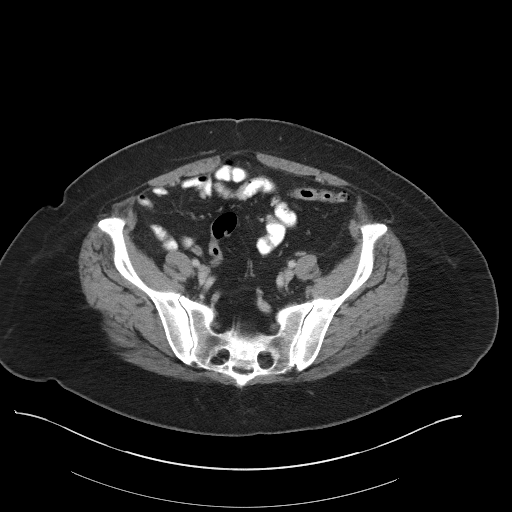
[im 45/102  soft-tissue]
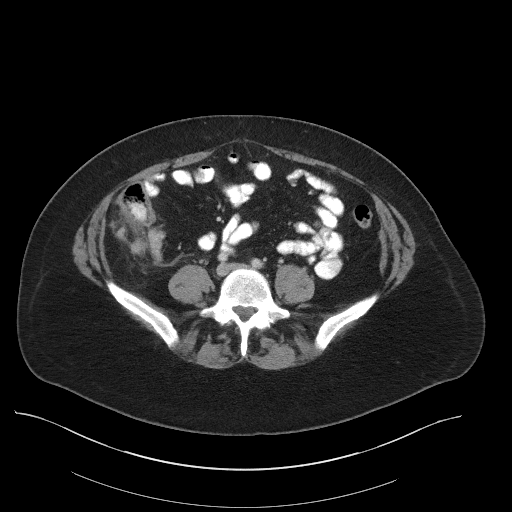
[im 51/102  soft-tissue]
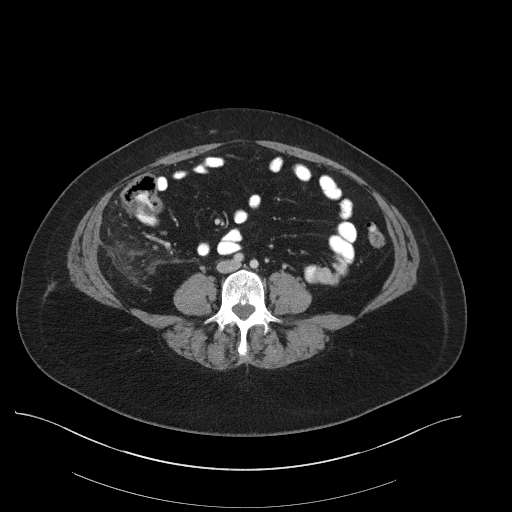
[im 57/102  soft-tissue]
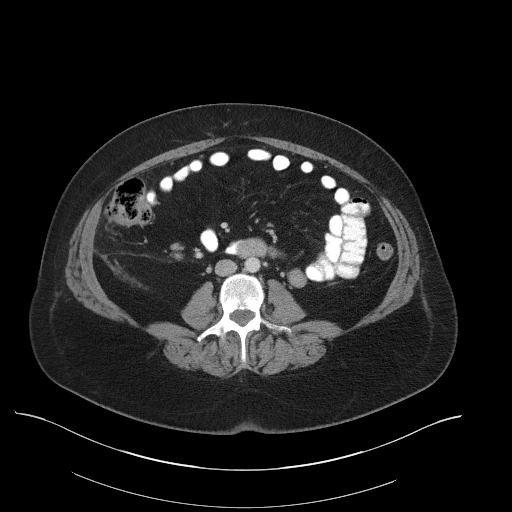
[im 68/102  soft-tissue]
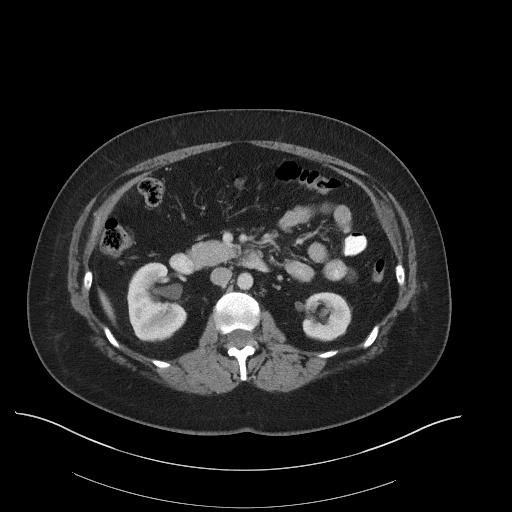
[im 68/102  bone]
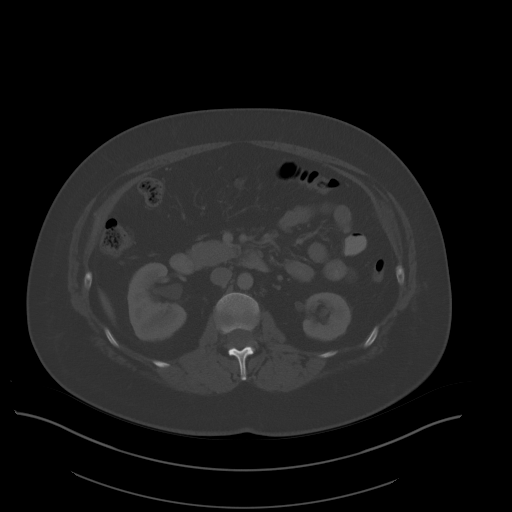
[im 73/102  soft-tissue]
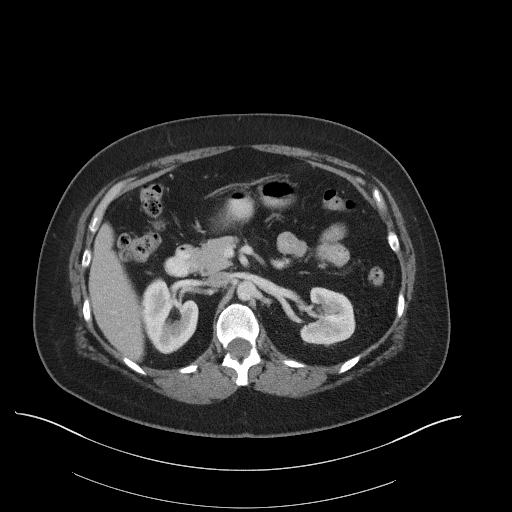
[im 79/102  soft-tissue]
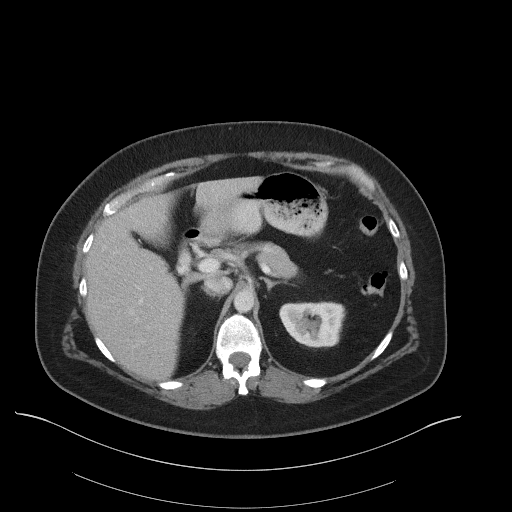
[im 90/102  soft-tissue]
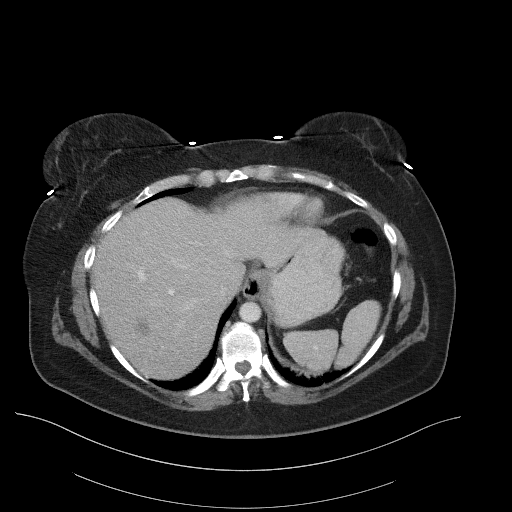
[im 96/102  soft-tissue]
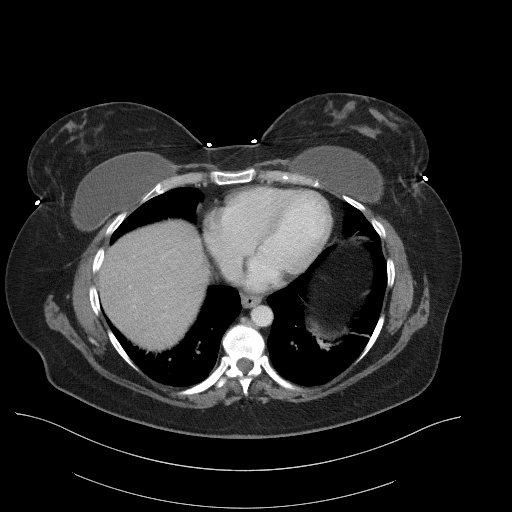

[Series 5: coronal st · coronal · 0.83mm/px · 3 of 103 slices shown]
[im 35/103  soft-tissue]
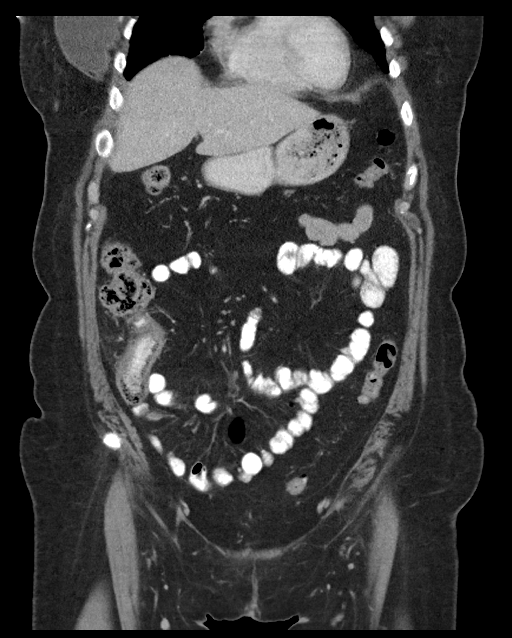
[im 46/103  soft-tissue]
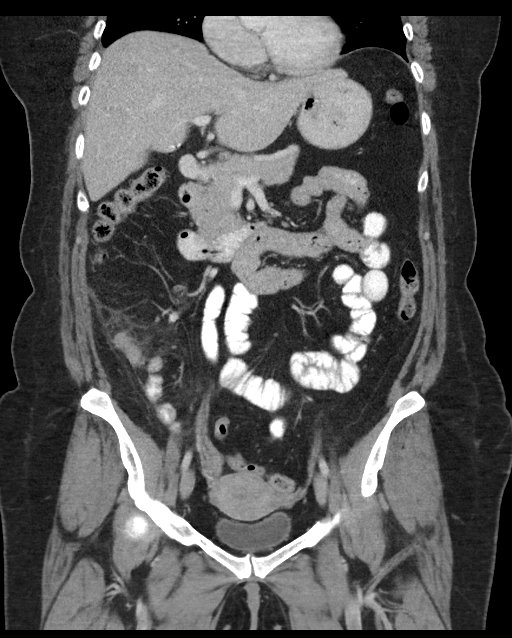
[im 57/103  soft-tissue]
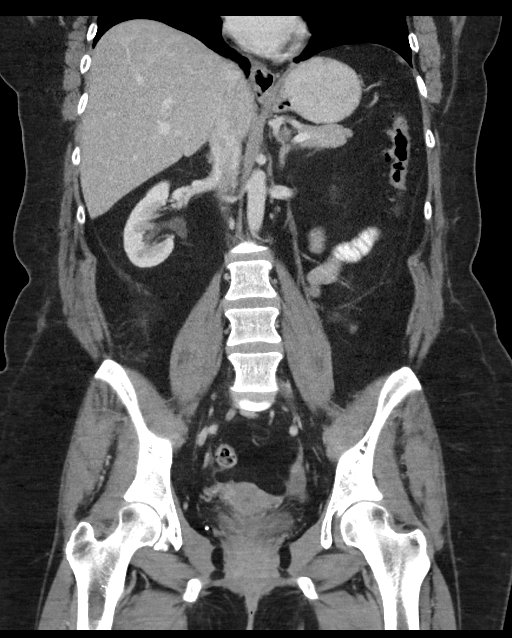

[16 of 46 positions shown; findings below may reference images not displayed]

FINDINGS: Lower chest: No acute abnormality.

Hepatobiliary: Status post cholecystectomy. 1.9 cm low density is
noted superiorly in right hepatic lobe which most likely represents
hemangioma, but is not diagnostic for this based on CT criteria. No
other abnormality is noted in the liver.

Pancreas: Unremarkable. No pancreatic ductal dilatation or
surrounding inflammatory changes.

Spleen: Normal in size without focal abnormality.

Adrenals/Urinary Tract: 1.4 cm left adrenal nodule is noted. Right
adrenal gland appears normal. No hydronephrosis or renal obstruction
is noted. No renal or ureteral calculi are noted. Urinary bladder is
unremarkable.

Stomach/Bowel: The stomach appears normal. There is no evidence of
bowel obstruction. The appendix is enlarged and inflamed consistent
with acute appendicitis. It is retrocecal in position.

Vascular/Lymphatic: No significant vascular findings are present. No
enlarged abdominal or pelvic lymph nodes.

Reproductive: Uterus and bilateral adnexa are unremarkable.

Other: No abdominal wall hernia or abnormality. No abdominopelvic
ascites.

Musculoskeletal: No acute or significant osseous findings.
IMPRESSION: Findings consistent with acute appendicitis. No definite abscess
formation is noted. The appendix is retrocecal in position. These
results will be called to the ordering clinician or representative
by the Radiologist Assistant, and communication documented in the
PACS or zVision Dashboard.

1.4 cm left adrenal nodule is noted. Follow-up CT scan or MRI in 12
months is recommended.

1.9 cm low density is noted in dome of right hepatic lobe which most
likely represents hemangioma, but is not diagnostic for this based
on CT criteria. Further evaluation with MRI of the liver with and
without gadolinium is recommended on nonemergent basis.
